# Patient Record
Sex: Male | Born: 1945 | Race: White | Hispanic: No | Marital: Married | Smoking: Former smoker
Health system: Southern US, Community
[De-identification: ages and names within clinical notes are randomized; demographics above are authoritative.]

## PROBLEM LIST (undated history)

## (undated) DIAGNOSIS — T7840XA Allergy, unspecified, initial encounter: Secondary | ICD-10-CM

## (undated) DIAGNOSIS — I350 Nonrheumatic aortic (valve) stenosis: Secondary | ICD-10-CM

## (undated) DIAGNOSIS — D649 Anemia, unspecified: Secondary | ICD-10-CM

## (undated) DIAGNOSIS — I340 Nonrheumatic mitral (valve) insufficiency: Secondary | ICD-10-CM

## (undated) DIAGNOSIS — D126 Benign neoplasm of colon, unspecified: Secondary | ICD-10-CM

## (undated) DIAGNOSIS — J449 Chronic obstructive pulmonary disease, unspecified: Secondary | ICD-10-CM

## (undated) DIAGNOSIS — N400 Enlarged prostate without lower urinary tract symptoms: Secondary | ICD-10-CM

## (undated) DIAGNOSIS — T8859XA Other complications of anesthesia, initial encounter: Secondary | ICD-10-CM

## (undated) DIAGNOSIS — R011 Cardiac murmur, unspecified: Secondary | ICD-10-CM

## (undated) DIAGNOSIS — I351 Nonrheumatic aortic (valve) insufficiency: Secondary | ICD-10-CM

## (undated) DIAGNOSIS — N182 Chronic kidney disease, stage 2 (mild): Secondary | ICD-10-CM

## (undated) DIAGNOSIS — I10 Essential (primary) hypertension: Secondary | ICD-10-CM

## (undated) HISTORY — DX: Benign prostatic hyperplasia without lower urinary tract symptoms: N40.0

## (undated) HISTORY — DX: Allergy, unspecified, initial encounter: T78.40XA

## (undated) HISTORY — DX: Anemia, unspecified: D64.9

## (undated) HISTORY — PX: COLONOSCOPY: SHX174

## (undated) HISTORY — DX: Essential (primary) hypertension: I10

## (undated) HISTORY — DX: Nonrheumatic mitral (valve) insufficiency: I34.0

## (undated) HISTORY — DX: Benign neoplasm of colon, unspecified: D12.6

## (undated) HISTORY — DX: Nonrheumatic aortic (valve) stenosis: I35.0

## (undated) HISTORY — PX: COLONOSCOPY W/ POLYPECTOMY: SHX1380

## (undated) HISTORY — DX: Nonrheumatic aortic (valve) insufficiency: I35.1

## (undated) HISTORY — DX: Chronic kidney disease, stage 2 (mild): N18.2

## (undated) HISTORY — DX: Chronic obstructive pulmonary disease, unspecified: J44.9

## (undated) HISTORY — PX: APPENDECTOMY: SHX54

---

## 2000-07-18 ENCOUNTER — Ambulatory Visit (HOSPITAL_COMMUNITY): Admission: RE | Admit: 2000-07-18 | Discharge: 2000-07-18 | Payer: Self-pay | Admitting: Gastroenterology

## 2005-01-24 ENCOUNTER — Ambulatory Visit: Payer: Self-pay | Admitting: Gastroenterology

## 2005-01-30 DIAGNOSIS — D126 Benign neoplasm of colon, unspecified: Secondary | ICD-10-CM

## 2005-01-30 HISTORY — DX: Benign neoplasm of colon, unspecified: D12.6

## 2005-02-18 ENCOUNTER — Ambulatory Visit: Payer: Self-pay | Admitting: Gastroenterology

## 2006-12-02 HISTORY — PX: GUM SURGERY: SHX658

## 2008-09-15 ENCOUNTER — Encounter (INDEPENDENT_AMBULATORY_CARE_PROVIDER_SITE_OTHER): Payer: Self-pay | Admitting: Otolaryngology

## 2008-09-15 ENCOUNTER — Ambulatory Visit (HOSPITAL_COMMUNITY): Admission: RE | Admit: 2008-09-15 | Discharge: 2008-09-15 | Payer: Self-pay | Admitting: Otolaryngology

## 2010-01-11 ENCOUNTER — Encounter (INDEPENDENT_AMBULATORY_CARE_PROVIDER_SITE_OTHER): Payer: Self-pay | Admitting: *Deleted

## 2010-06-21 ENCOUNTER — Encounter (INDEPENDENT_AMBULATORY_CARE_PROVIDER_SITE_OTHER): Payer: Self-pay | Admitting: *Deleted

## 2010-06-22 ENCOUNTER — Ambulatory Visit: Payer: Self-pay | Admitting: Gastroenterology

## 2010-07-06 ENCOUNTER — Ambulatory Visit: Payer: Self-pay | Admitting: Gastroenterology

## 2010-07-11 ENCOUNTER — Encounter: Payer: Self-pay | Admitting: Gastroenterology

## 2011-01-01 NOTE — Miscellaneous (Signed)
Summary: previsit/rm  Clinical Lists Changes  Medications: Added new medication of MOVIPREP 100 GM  SOLR (PEG-KCL-NACL-NASULF-NA ASC-C) As per prep instructions. - Signed Rx of MOVIPREP 100 GM  SOLR (PEG-KCL-NACL-NASULF-NA ASC-C) As per prep instructions.;  #1 x 0;  Signed;  Entered by: Sherren Kerns RN;  Authorized by: Meryl Dare MD [facility];  Method used: [facility]  [phone]*, [address], [place], [place]  [address], Ph: [phone] or [phone], Fax: [phone] Allergies: Added new allergy or adverse reaction of * INNOVAR Observations: Added new observation of ALLERGY REV: Done (06/22/2010 12:27) Added new observation of NKA: F (06/22/2010 12:27)    Prescriptions: MOVIPREP 100 GM  SOLR (PEG-KCL-NACL-NASULF-NA ASC-C) As per prep instructions.  #1 x 0   Entered by:   Sherren Kerns RN   Authorized by:   Meryl Dare MD [facility]   Signed by:   Sherren Kerns RN on 06/22/2010   Method used:   [facility]ronically to        [facility]  [phone]* (retail)       [address] [place], [place]  [address]       Ph: [phone] or [phone]       Fax: [phone]   RxID:   [id number]

## 2011-01-01 NOTE — Letter (Signed)
Summary: Moviprep Instructions  [facility]  [address]   [place], [place] [address]   Phone: [phone]  Fax: [phone]       Stephen Lang    05-21-1946    MRN: [id number]        Procedure Day /Date: Friday, 07-06-10     Arrival Time: 8:00 a.m.      Procedure Time: 9:00 a.m.     Location of Procedure:                    _x _  [facility] (4th Floor)  PREPARATION FOR COLONOSCOPY WITH MOVIPREP   Starting 5 days prior to your procedure  07-01-10 do not eat nuts, seeds, popcorn, corn, beans, peas,  salads, or any raw vegetables.  Do not take any fiber supplements (e.g. Metamucil, Citrucel, and Benefiber).  THE DAY BEFORE YOUR PROCEDURE         DATE: 07-05-10   DAY: Thursday  1.  Drink clear liquids the entire day-NO SOLID FOOD  2.  Do not drink anything colored red or purple.  Avoid juices with pulp.  No orange juice.  3.  Drink at least 64 oz. (8 glasses) of fluid/clear liquids during the day to prevent dehydration and help the prep work efficiently.  CLEAR LIQUIDS INCLUDE: Water Jello Ice Popsicles Tea (sugar ok, no milk/cream) Powdered fruit flavored drinks Coffee (sugar ok, no milk/cream) Gatorade Juice: apple, white grape, white cranberry  Lemonade Clear bullion, consomm, broth Carbonated beverages (any kind) Strained chicken noodle soup Hard Candy                             4.  In the morning, mix first dose of MoviPrep solution:    Empty 1 Pouch A and 1 Pouch B into the disposable container    Add lukewarm drinking water to the top line of the container. Mix to dissolve    Refrigerate (mixed solution should be used within 24 hrs)  5.  Begin drinking the prep at 5:00 p.m. The MoviPrep container is divided by 4 marks.   Every 15 minutes drink the solution down to the next mark (approximately 8 oz) until the full liter is complete.   6.  Follow completed prep with 16 oz of clear liquid of your choice (Nothing red or purple).   Continue to drink clear liquids until bedtime.  7.  Before going to bed, mix second dose of MoviPrep solution:    Empty 1 Pouch A and 1 Pouch B into the disposable container    Add lukewarm drinking water to the top line of the container. Mix to dissolve    Refrigerate  THE DAY OF YOUR PROCEDURE      DATE:  07-06-10  DAY: Friday  Beginning at 4:00 a.m. (5 hours before procedure):         1. Every 15 minutes, drink the solution down to the next mark (approx 8 oz) until the full liter is complete.  2. Follow completed prep with 16 oz. of clear liquid of your choice.    3. You may drink clear liquids until  7:00 a.m.  (2 HOURS BEFORE PROCEDURE).   MEDICATION INSTRUCTIONS  Unless otherwise instructed, you should take regular prescription medications with a small sip of water   as early as possible the morning of your procedure.    Additional medication instructions: n/a  OTHER INSTRUCTIONS  You will need a responsible adult at least 67 years of age to accompany you and drive you home.   This person must remain in the waiting room during your procedure.  Wear loose fitting clothing that is easily removed.  Leave jewelry and other valuables at home.  However, you may wish to bring a book to read or  an iPod/MP3 player to listen to music as you wait for your procedure to start.  Remove all body piercing jewelry and leave at home.  Total time from sign-in until discharge is approximately 2-3 hours.  You should go home directly after your procedure and rest.  You can resume normal activities the  day after your procedure.  The day of your procedure you should not:   Drive   Make legal decisions   Operate machinery   Drink alcohol   Return to work  You will receive specific instructions about eating, activities and medications before you leave.    The above instructions have been reviewed and explained to me by   Sherren Kerns RN  June 22, 2010 1:14  PM     I fully understand and can verbalize these instructions _____________________________ Date _________

## 2011-01-01 NOTE — Procedures (Signed)
Summary: Colonoscopy  Patient: Stephen Lang Note: All result statuses are Final unless otherwise noted.  Tests: (1) Colonoscopy (COL)   COL Colonoscopy           DONE     [facility]     [address]     [place], [place]  [address]           COLONOSCOPY PROCEDURE REPORT           PATIENT:  Stephen, Lang  MR#:  [id number]     BIRTHDATE:  04/07/1946, 63 yrs. old  GENDER:  male     ENDOSCOPIST:  Judie Petit T. Russella Dar, MD, [facility]           PROCEDURE DATE:  07/06/2010     PROCEDURE:  Colonoscopy with biopsy, snare polypectomy, and hot     biopsy     ASA CLASS:  Class II     INDICATIONS:  1) follow-up of polyp  2) surveillance and high-risk     screening, adenomatous polyp, 01/2005.     MEDICATIONS:   Demerol 50 mg IV, Versed 6 mg IV     DESCRIPTION OF PROCEDURE:   After the risks benefits and     alternatives of the procedure were thoroughly explained, informed     consent was obtained.  Digital rectal exam was performed and     revealed no abnormalities.   The LB [id number]-[id number] endoscope     was introduced through the anus and advanced to the cecum, which     was identified by both the appendix and ileocecal valve, without     limitations.  The quality of the prep was excellent, using     MoviPrep.  The instrument was then slowly withdrawn as the colon     was fully examined.     <<PROCEDUREIMAGES>>           FINDINGS:  A sessile polyp was found in the ascending colon. It     was 6 mm in size. Polyp was snared without cautery. Retrieval was     successful.  A sessile polyp was found in the ascending colon. It     was 2 mm in size. The polyp was removed using cold biopsy forceps.     A sessile polyp was found in the sigmoid colon. It was 5 mm in     size. With hot biopsy forceps the polyp was cauterized, biopsy was     obtained and sent to pathology.  This was otherwise a normal     examination of the colon. Retroflexed views in the rectum revealed     internal  hemorrhoids, small. The time to cecum =  3.75  minutes.     The scope was then withdrawn (time =  12.5  min) from the patient     and the procedure completed.           COMPLICATIONS:  None           ENDOSCOPIC IMPRESSION:     1) 6 mm sessile polyp in the ascending colon     2) 2 mm sessile polyp in the ascending colon     3) 5 mm sessile polyp in the sigmoid colon     4) Internal hemorrhoids           RECOMMENDATIONS:     1) No aspirin or NSAID's for 2 weeks     2) Await pathology results     3)  Repeat Colonoscopy in 5 years pending pathology review           Malcolm T. Russella Dar, MD, [facility]           CC: Georgann Housekeeper MD           n.     Rosalie DoctorVenita Lick. Stark at 07/06/2010 09:26 AM           Karle Plumber, [phone]  Note: An exclamation mark (!) indicates a result that was not dispersed into the flowsheet. Document Creation Date: 07/06/2010 9:27 AM _______________________________________________________________________  (1) Order result status: Final Collection or observation date-time: 07/06/2010 09:21 Requested date-time:  Receipt date-time:  Reported date-time:  Referring Physician:   Ordering Physician: Claudette Head [phone]) Specimen Source:  Source: Launa Grill Order Number: [id number] Lab site:   Appended Document: Colonoscopy     Procedures Next Due Date:    Colonoscopy: 07/2015

## 2011-01-01 NOTE — Letter (Signed)
Summary: Colonoscopy Letter  [facility]  [address] [place], [place] [address]   Phone: [phone]  Fax: [phone]      January 11, 2010 MRN: [id number]   [facility] [address] [place], [place]  [address]   Dear Mr. Bergstresser,   According to your medical record, it is time for you to schedule a Colonoscopy. The American Cancer Society recommends this procedure as a method to detect early colon cancer. Patients with a family history of colon cancer, or a personal history of colon polyps or inflammatory bowel disease are at increased risk.  This letter has beeen generated based on the recommendations made at the time of your procedure. If you feel that in your particular situation this may no longer apply, please contact our office.  Please call our office at [phone] to schedule this appointment or to update your records at your earliest convenience.  Thank you for cooperating with [place] to provide you with the very best care possible.   Sincerely,  Judie Petit T. Russella Dar, M.D.  [facility] [phone]

## 2011-01-01 NOTE — Letter (Signed)
Summary: Patient Notice- Polyp Results  [facility]  [address] [place], [place] [address]   Phone: [phone]  Fax: [phone]        July 11, 2010 MRN: [id number]    [facility] [address] [place], [place]  [address]    Dear Mr. Grippi,  I am pleased to inform you that the colon polyp(s) removed during your recent colonoscopy was (were) found to be benign (no cancer detected) upon pathologic examination.  I recommend you have a repeat colonoscopy examination in 5 years to look for recurrent polyps, as having colon polyps increases your risk for having recurrent polyps or even colon cancer in the future.  Should you develop new or worsening symptoms of abdominal pain, bowel habit changes or bleeding from the rectum or bowels, please schedule an evaluation with either your primary care physician or with me.  Continue treatment plan as outlined the day of your exam.  Please call us if you are having persistent problems or have questions about your condition that have not been fully answered at this time.  Sincerely,  Meryl Dare MD [facility]  This letter has been electronically signed by your physician.  Appended Document: Patient Notice- Polyp Results letter mailed

## 2011-04-16 NOTE — Op Note (Signed)
NAMEDEMONTEZ, NOVACK               ACCOUNT NO.:  [id number]   MEDICAL RECORD NO.:  [id number]          PATIENT TYPE:  AMB   LOCATION:  SDS                          FACILITY:  [facility]   PHYSICIAN:  Kinnie Scales. Annalee Genta, M.D.DATE OF BIRTH:  [birth year]   DATE OF PROCEDURE:  09/15/2008  DATE OF DISCHARGE:                               OPERATIVE REPORT   LOCATION:  [facility] OR.   PREOPERATIVE DIAGNOSES:  1. 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  2. Multiple prior root canals right maxillary dentition.   POSTOPERATIVE DIAGNOSES:  1. 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  2. Multiple prior root canals right maxillary dentition.   INDICATIONS FOR SURGERY:  1. 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  2. Multiple prior root canals right maxillary dentition.   SURGICAL PROCEDURE:  Right Caldwell-Luc procedure with biopsy and  removal of right maxillary sinus mass.   SURGEON:  Kinnie Scales. Annalee Genta, MD   ANESTHESIA:  General endotracheal.   COMPLICATIONS:  None.   ESTIMATED BLOOD LOSS:  Less than 50 mL.   The patient transferred from the operating room to the recovery room in  stable condition.   FINDINGS:  The patient was found to have a cystic-appearing mass in the  inferior aspect of the right maxillary sinus with anterior maxillary  bone erosion and some soft tissue extension into the anterior maxillary  soft tissue.  The mass showed degenerative changes with mucopurulent  drainage, which was cultured.  Initial frozen section showed no evidence  of malignancy.  Remainder of the specimen was sent to pathology for  permanent pathologic evaluation.   BRIEF HISTORY:  Mr. Remmers is a 65 year old white male who is referred  by his endodontist, Dr. Retta Mac for evaluation of a right maxillary sinus  mass with erosion of the anterior maxilla.  Over the last 6 months, the  patient undergone 3 root  canals in the right maxillary dentition,  initially treated in February.  The patient was found to have a bony  erosion, followup evaluation and retreatment in September showed  continued bone erosion without healing and soft tissue opacification of  the inferior aspect of the right maxillary sinus.  The patient was  evaluated in our office, had no prior sinus history.  He was essentially  asymptomatic with the exception of right maxillary dental pain.  A CT  scan was obtained, which showed soft tissue filling the lower half of  the maxillary sinus with extension from the sinus through a bone erosion  in the anterior maxilla with extension into the soft tissue of the right  cheek.  Given the findings and physical examination, I recommended to  undertake a right Caldwell-Luc for biopsy and removal of abnormal  tissue.  The risks, benefits, and possible complications of the  procedure were discussed in detail with the patient and his wife and  they understood and concurred with our plan for surgery, which was  scheduled as an outpatient under general anesthesia at [facility] OR.   PROCEDURE:  The patient was brought to the operating room on September 15, 2008, placed in supine position on the operating table.  General  endotracheal anesthesia was established without difficulty.  When the  patient was adequately anesthetized, he was injected with a total of 3  mL of 1% lidocaine, and 1:1000 solution of epinephrine, which was  injected in a submucosal fashion in the right maxillary gingivobuccal  sulcus.  After allowing adequate time for vasoconstriction and  hemostasis, the patient was positioned on the operating table, prepped  and draped in a sterile fashion.   A 3-cm horizontally oriented mucosal incision was created in the right  maxillary gingivobuccal sulcus.  The mucosa and soft tissue of the cheek  was then gently elevated.  The area of bony dehiscence was gently   palpated and the elevation was extended superiorly through the area of  intact maxillary bone.  The soft tissue surrounding the bone erosion was  then gently elevated and included in the surgical specimen.  The  inferior aspect of the maxillary sinus mucosa was then elevated free and  removed, small portion was sent to pathology for frozen section analysis  and was returned as benign appearing.  Remainder of the surgical  specimen was sent for additional microscopic analysis.  There was a  thick mucopurulent material within the inferior aspect of the maxillary  sinus and this was cultured for aerobic and anaerobic culture and Gram  stain.  The inflamed, thickened abnormal-appearing mucosa was then  completely resected preserving maxillary sinus mucosa in the superior,  lateral, and posterior aspect of the maxillary sinus including the  natural ostium of the maxillary sinus.  The patient's maxillary sinus  was then thoroughly irrigated with copious amounts of sterile saline.  The incision was then closed in layers with approximation of the  periosteum and deep muscular layer with a 4-0 chromic suture and final  skin edge approximation with interrupted 4-0 chromic.  The patient was  then awakened from his anesthetic, extubated, and transferred from the  operating room to recovery in stable condition.  There were no  complications and blood loss was less than 50 mL.           ______________________________  Kinnie Scales. Annalee Genta, M.D.     DLS/MEDQ  D:  16/09/9603  T:  09/15/2008  Job:  [id number]

## 2011-04-19 NOTE — Op Note (Signed)
[facility]  Patient:    Stephen Lang, Stephen Lang                        MRN: [id number] Proc. Date: 07/18/00 Attending:  Verlin Grills, M.D.                           Operative Report  PROCEDURE:  Flexible proctocolonoscopy to the splenic flexure.  REFERRING PHYSICIAN:  Dr. Lum Babe.  INDICATIONS FOR PROCEDURE:  [name] is a 65 year old male who is referred by Dr. Demetrius Revel for colorectal neoplasia screening. [name] was scheduled to undergo a complete colonoscopy. I was only able to advance the endoscope to the hepatic flexure due to colonic loop formation. The colon was only adequately prepped from the rectum to the splenic flexure. The exam today only adequately includes the left colon.  I discussed with the patient the complications associated with colonoscopy and polypectomy including intestinal bleeding and intestinal perforation. The patient has signed the operative permit.  ENDOSCOPIST:  Verlin Grills, M.D.  PREMEDICATION:  Versed 5 mg.  ENDOSCOPE:  Olympus pediatric colonoscope.  DESCRIPTION OF PROCEDURE:  After obtaining informed consent, the patient was placed in the left lateral decubitus position. I administered intravenous Versed to achieve conscious sedation for the procedure. The patients cardiac rhythm, oxygen saturation and blood pressure were monitored throughout the procedure and documented in the medical record.  Anal inspection was normal. Digital rectal exam revealed a non-nodular prostate. The Olympus pediatric video colonoscope was introduced into the rectum and under direct vision advanced to the hepatic flexure. Due to colonic loop formation, I was unable to examine the ascending colon, cecum, or ileocecal valve. Due to a stool filled hepatic flexure and transverse colon, I was unable to adequately examine this portion of the colon. The colon was well prepped from the splenic flexure to the  rectum.  Endoscopic appearance of the rectum, sigmoid colon, descending colon, and splenic flexure was normal. There is no evidence of colorectal neoplasia.  ASSESSMENT:  Normal proctocolonoscopy to the splenic flexure.  RECOMMENDATIONS:  I would recommend an air contrast barium enema if it is felt [name] needs a complete colon exam. DD:  07/18/00 TD:  07/19/00 Job: [id number]

## 2011-09-03 LAB — CBC
HCT: 45.2
Hemoglobin: 15.3
MCHC: 33.8
RBC: 4.82

## 2011-09-03 LAB — BASIC METABOLIC PANEL
CO2: 26
Calcium: 9.6
Chloride: 105
GFR calc Af Amer: >60
Potassium: 4.8
Sodium: 138

## 2011-09-03 LAB — ANAEROBIC CULTURE

## 2011-09-03 LAB — CULTURE, ROUTINE-SINUS

## 2012-02-20 ENCOUNTER — Other Ambulatory Visit: Payer: Self-pay | Admitting: Dermatology

## 2012-04-16 ENCOUNTER — Other Ambulatory Visit (HOSPITAL_COMMUNITY): Payer: Self-pay | Admitting: Internal Medicine

## 2012-04-16 DIAGNOSIS — J449 Chronic obstructive pulmonary disease, unspecified: Secondary | ICD-10-CM

## 2012-05-07 ENCOUNTER — Encounter (HOSPITAL_COMMUNITY): Payer: Self-pay

## 2012-06-25 ENCOUNTER — Encounter (HOSPITAL_COMMUNITY): Payer: Self-pay

## 2012-07-02 ENCOUNTER — Ambulatory Visit (HOSPITAL_COMMUNITY)
Admission: RE | Admit: 2012-07-02 | Discharge: 2012-07-02 | Disposition: A | Discharge status: Home / Self Care | Payer: BC Managed Care – PPO | Source: Ambulatory Visit | Attending: Internal Medicine | Admitting: Internal Medicine

## 2012-07-02 DIAGNOSIS — J4489 Other specified chronic obstructive pulmonary disease: Secondary | ICD-10-CM | POA: Insufficient documentation

## 2012-07-02 DIAGNOSIS — J449 Chronic obstructive pulmonary disease, unspecified: Secondary | ICD-10-CM | POA: Insufficient documentation

## 2012-07-02 MED ORDER — ALBUTEROL SULFATE (5 MG/ML) 0.5% IN NEBU
2.5000 mg | INHALATION_SOLUTION | Freq: Once | RESPIRATORY_TRACT | Status: AC
  Administered 2012-07-02: 2.5 mg via RESPIRATORY_TRACT

## 2012-07-13 DIAGNOSIS — R319 Hematuria, unspecified: Secondary | ICD-10-CM | POA: Insufficient documentation

## 2012-07-13 DIAGNOSIS — N400 Enlarged prostate without lower urinary tract symptoms: Secondary | ICD-10-CM | POA: Insufficient documentation

## 2012-07-14 ENCOUNTER — Other Ambulatory Visit: Payer: Self-pay | Admitting: Urology

## 2012-07-14 DIAGNOSIS — R319 Hematuria, unspecified: Secondary | ICD-10-CM

## 2012-07-16 ENCOUNTER — Other Ambulatory Visit: Payer: BC Managed Care – PPO

## 2012-07-16 ENCOUNTER — Ambulatory Visit
Admission: RE | Admit: 2012-07-16 | Discharge: 2012-07-16 | Disposition: A | Discharge status: Home / Self Care | Payer: BC Managed Care – PPO | Source: Ambulatory Visit | Attending: Urology | Admitting: Urology

## 2012-07-16 DIAGNOSIS — R319 Hematuria, unspecified: Secondary | ICD-10-CM

## 2014-07-15 DIAGNOSIS — N320 Bladder-neck obstruction: Secondary | ICD-10-CM | POA: Insufficient documentation

## 2015-06-12 ENCOUNTER — Encounter: Payer: Self-pay | Admitting: Gastroenterology

## 2015-07-20 DIAGNOSIS — N401 Enlarged prostate with lower urinary tract symptoms: Secondary | ICD-10-CM

## 2015-07-20 DIAGNOSIS — N138 Other obstructive and reflux uropathy: Secondary | ICD-10-CM | POA: Insufficient documentation

## 2015-09-28 ENCOUNTER — Telehealth: Payer: Self-pay | Admitting: Gastroenterology

## 2015-09-28 NOTE — Telephone Encounter (Signed)
Patient is due for recall now according to letter from 2011

## 2015-09-28 NOTE — Telephone Encounter (Signed)
Called pt to advice him. No answer. Left a message oh his answering machine.

## 2015-10-19 ENCOUNTER — Encounter: Payer: Self-pay | Admitting: Gastroenterology

## 2015-12-01 ENCOUNTER — Ambulatory Visit (AMBULATORY_SURGERY_CENTER): Payer: Self-pay | Admitting: *Deleted

## 2015-12-01 VITALS — Ht 72.0 in | Wt 202.0 lb

## 2015-12-01 DIAGNOSIS — Z86010 Personal history of colonic polyps: Secondary | ICD-10-CM

## 2015-12-01 MED ORDER — NA SULFATE-K SULFATE-MG SULF 17.5-3.13-1.6 GM/177ML PO SOLN: ORAL | Status: DC

## 2015-12-01 NOTE — Progress Notes (Signed)
Patient denies any allergies to eggs or soy. Patient denies any problems with anesthesia/sedation. Patient denies any oxygen use at home and does not take any diet/weight loss medications. EMMI education assisgned to patient on colonoscopy, this was explained and instructions given to patient. 

## 2015-12-15 ENCOUNTER — Encounter: Payer: Self-pay | Admitting: Gastroenterology

## 2016-01-12 ENCOUNTER — Encounter: Payer: Self-pay | Admitting: Gastroenterology

## 2016-02-02 ENCOUNTER — Encounter: Payer: Self-pay | Admitting: Gastroenterology

## 2016-02-02 ENCOUNTER — Ambulatory Visit (AMBULATORY_SURGERY_CENTER): Payer: BLUE CROSS/BLUE SHIELD | Admitting: Gastroenterology

## 2016-02-02 VITALS — BP 117/65 | HR 72 | Temp 97.9°F | Resp 14 | Ht 72.0 in | Wt 202.0 lb

## 2016-02-02 DIAGNOSIS — Z86010 Personal history of colonic polyps: Secondary | ICD-10-CM | POA: Diagnosis not present

## 2016-02-02 DIAGNOSIS — D123 Benign neoplasm of transverse colon: Secondary | ICD-10-CM

## 2016-02-02 DIAGNOSIS — D120 Benign neoplasm of cecum: Secondary | ICD-10-CM | POA: Diagnosis not present

## 2016-02-02 MED ORDER — SODIUM CHLORIDE 0.9 % IV SOLN: 500.0000 mL | INTRAVENOUS | Status: DC

## 2016-02-02 NOTE — Progress Notes (Signed)
Called to room to assist during endoscopic procedure.  Patient ID and intended procedure confirmed with present staff. Received instructions for my participation in the procedure from the performing physician.  

## 2016-02-02 NOTE — Progress Notes (Signed)
Stable to RR 

## 2016-02-02 NOTE — Patient Instructions (Signed)
YOU HAD AN ENDOSCOPIC PROCEDURE TODAY AT [facility]:   Refer to the procedure report that was given to you for any specific questions about what was found during the examination.  If the procedure report does not answer your questions, please call your gastroenterologist to clarify.  If you requested that your care partner not be given the details of your procedure findings, then the procedure report has been included in a sealed envelope for you to review at your convenience later.  YOU SHOULD EXPECT: Some feelings of bloating in the abdomen. Passage of more gas than usual.  Walking can help get rid of the air that was put into your GI tract during the procedure and reduce the bloating. If you had a lower endoscopy (such as a colonoscopy or flexible sigmoidoscopy) you may notice spotting of blood in your stool or on the toilet paper. If you underwent a bowel prep for your procedure, you may not have a normal bowel movement for a few days.  Please Note:  You might notice some irritation and congestion in your nose or some drainage.  This is from the oxygen used during your procedure.  There is no need for concern and it should clear up in a day or so.  SYMPTOMS TO REPORT IMMEDIATELY:   Following lower endoscopy (colonoscopy or flexible sigmoidoscopy):  Excessive amounts of blood in the stool  Significant tenderness or worsening of abdominal pains  Swelling of the abdomen that is new, acute  Fever of 100F or higher   For urgent or emergent issues, a gastroenterologist can be reached at any hour by calling [phone].   DIET: Your first meal following the procedure should be a small meal and then it is ok to progress to your normal diet. Heavy or fried foods are harder to digest and may make you feel nauseous or bloated.  Likewise, meals heavy in dairy and vegetables can increase bloating.  Drink plenty of fluids but you should avoid alcoholic beverages for 24  hours.  ACTIVITY:  You should plan to take it easy for the rest of today and you should NOT DRIVE or use heavy machinery until tomorrow (because of the sedation medicines used during the test).    FOLLOW UP: Our staff will call the number listed on your records the next business day following your procedure to check on you and address any questions or concerns that you may have regarding the information given to you following your procedure. If we do not reach you, we will leave a message.  However, if you are feeling well and you are not experiencing any problems, there is no need to return our call.  We will assume that you have returned to your regular daily activities without incident.  If any biopsies were taken you will be contacted by phone or by letter within the next 1-3 weeks.  Please call us at [phone] if you have not heard about the biopsies in 3 weeks.    SIGNATURES/CONFIDENTIALITY: You and/or your care partner have signed paperwork which will be entered into your electronic medical record.  These signatures attest to the fact that that the information above on your After Visit Summary has been reviewed and is understood.  Full responsibility of the confidentiality of this discharge information lies with you and/or your care-partner.  Polyp/ Hemorrhoid handout given Await pathology results Repeat Colonoscopy in 3-4 years

## 2016-02-02 NOTE — Op Note (Addendum)
[name] [name], [address]   COLONOSCOPY PROCEDURE REPORT  PATIENT: Stephen Lang, Stephen Lang  MR#: [id number] BIRTHDATE: Sep 21, 1946 , 69  yrs. old GENDER: male ENDOSCOPIST: Ladene Artist, MD, [facility] REFERRED BY:  Domenick Gong, M.D. PROCEDURE DATE:  02/02/2016 PROCEDURE:   Colonoscopy, surveillance , Colonoscopy with biopsy, and Colonoscopy with snare polypectomy First Screening Colonoscopy - Avg.  risk and is 50 yrs.  old or older - No.  Prior Negative Screening - Now for repeat screening. N/A  History of Adenoma - Now for follow-up colonoscopy & has been > or = to 3 yrs.  Yes hx of adenoma.  Has been 3 or more years since last colonoscopy.  Polyps removed today? Yes ASA CLASS:   Class II INDICATIONS:Surveillance due to prior colonic neoplasia and PH Colon Adenoma. MEDICATIONS: Monitored anesthesia care, Propofol 400 mg IV, and lidocaine 40 mg IV DESCRIPTION OF PROCEDURE:   After the risks benefits and alternatives of the procedure were thoroughly explained, informed consent was obtained.  The digital rectal exam revealed no abnormalities of the rectum.   The LB [id number]-[id number]  endoscope was introduced through the anus and advanced to the cecum, which was identified by both the appendix and ileocecal valve. No adverse events experienced.   Limited by a tortuous and redundant colon. The quality of the prep was poor.  (Suprep was used)  The instrument was then slowly withdrawn as the colon was fully examined. Estimated blood loss is zero unless otherwise noted in this procedure report.  COLON FINDINGS: Two sessile polyps measuring 5-7 mm in size were found in the transverse colon and at the cecum.  Polypectomies were performed with a cold snare.  The resection was complete, the polyp tissue was completely retrieved and sent to histology.   Two sessile polyps measuring 4-5 mm in size were found in the transverse colon.  Polypectomies were  performed with cold forceps. The resection was complete, the polyp tissue was completely retrieved and sent to histology. 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  The examination was otherwise normal. Retroflexed views revealed internal Grade I hemorrhoids. The time to cecum = 3.9 Withdrawal time = 14.8   The scope was withdrawn and the procedure completed. COMPLICATIONS: There were no immediate complications.  ENDOSCOPIC IMPRESSION: 1.   Two sessile polyps in the transverse colon and at the cecum; polypectomies performed with a cold snare 2.   Two sessile polyps in the transverse colon; polypectomies performed with cold forceps 3.   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   Grade l internal hemorrhoids  RECOMMENDATIONS: 1.  Await pathology results 2.  Repeat colonoscopy in 3 years if 3-4 polyps adenomatous; otherwise 5 years  eSigned:  Ladene Artist, MD, [facility] 02/02/2016 3:20 PM Revised: 02/02/2016 3:20 PM

## 2016-02-05 ENCOUNTER — Telehealth: Payer: Self-pay | Admitting: *Deleted

## 2016-02-05 NOTE — Telephone Encounter (Signed)
  Follow up Call-  Call back number 02/02/2016  Post procedure Call Back phone  # [phone]  Permission to leave phone message Yes     Patient questions:  Do you have a fever, pain , or abdominal swelling? No. Pain Score  0 *  Have you tolerated food without any problems? Yes.    Have you been able to return to your normal activities? Yes.    Do you have any questions about your discharge instructions: Diet   No. Medications  No. Follow up visit  No.  Do you have questions or concerns about your Care? No.  Actions: * If pain score is 4 or above: No action needed, pain <4.

## 2016-02-19 ENCOUNTER — Encounter: Payer: Self-pay | Admitting: Gastroenterology

## 2016-03-22 DIAGNOSIS — J310 Chronic rhinitis: Secondary | ICD-10-CM | POA: Insufficient documentation

## 2016-03-22 DIAGNOSIS — J302 Other seasonal allergic rhinitis: Secondary | ICD-10-CM | POA: Insufficient documentation

## 2018-01-22 ENCOUNTER — Ambulatory Visit: Payer: BLUE CROSS/BLUE SHIELD | Admitting: Gastroenterology

## 2018-01-29 ENCOUNTER — Ambulatory Visit: Payer: BLUE CROSS/BLUE SHIELD | Admitting: Gastroenterology

## 2018-02-26 ENCOUNTER — Ambulatory Visit: Payer: BLUE CROSS/BLUE SHIELD | Admitting: Gastroenterology

## 2018-04-03 ENCOUNTER — Encounter: Payer: Self-pay | Admitting: Gastroenterology

## 2018-04-03 ENCOUNTER — Ambulatory Visit: Payer: Medicare Other | Admitting: Gastroenterology

## 2018-04-03 ENCOUNTER — Encounter (INDEPENDENT_AMBULATORY_CARE_PROVIDER_SITE_OTHER): Payer: Self-pay

## 2018-04-03 VITALS — BP 124/50 | HR 72 | Ht 72.0 in | Wt 188.0 lb

## 2018-04-03 DIAGNOSIS — Z86010 Personal history of colonic polyps: Secondary | ICD-10-CM | POA: Diagnosis not present

## 2018-04-03 DIAGNOSIS — R195 Other fecal abnormalities: Secondary | ICD-10-CM | POA: Diagnosis not present

## 2018-04-03 MED ORDER — NA SULFATE-K SULFATE-MG SULF 17.5-3.13-1.6 GM/177ML PO SOLN: 1.0000 | Freq: Once | ORAL | 0 refills | Status: AC

## 2018-04-03 NOTE — Patient Instructions (Signed)
You have been scheduled for a colonoscopy. Please follow written instructions given to you at your visit today.  Please pick up your prep supplies at the pharmacy within the next 1-3 days. If you use inhalers (even only as needed), please bring them with you on the day of your procedure. Your physician has requested that you go to www.startemmi.com and enter the access code given to you at your visit today. This web site gives a general overview about your procedure. However, you should still follow specific instructions given to you by our office regarding your preparation for the procedure.  Normal BMI (Body Mass Index- based on height and weight) is between 23 and 30. Your BMI today is Body mass index is 25.5 kg/m. Marland Kitchen Please consider follow up  regarding your BMI with your Primary Care Provider.  Thank you for choosing me and [facility].  Pricilla Riffle. Dagoberto Ligas., MD., [facility]

## 2018-04-03 NOTE — Progress Notes (Signed)
    History of Present Illness: This is a 72 year old male with occult blood in his stool.  Recent screening Hemosure testing performed at Dr. Loren Racer office was positive.  He underwent colonoscopy in March 2017 showing 4 small adenomatous colon polyps, an AVM and internal hemorrhoids.  He has no gastrointestinal complaints. Denies weight loss, abdominal pain, constipation, diarrhea, change in stool caliber, melena, hematochezia, nausea, vomiting, dysphagia, reflux symptoms, chest pain.  Current Medications, Allergies, Past Medical History, Past Surgical History, Family History and Social History were reviewed in [employer] record.  Physical Exam: General: Well developed, well nourished, no acute distress Head: Normocephalic and atraumatic Eyes:  sclerae anicteric, EOMI Ears: Normal auditory acuity Mouth: No deformity or lesions Lungs: Clear throughout to auscultation Heart: Regular rate and rhythm; no murmurs, rubs or bruits Abdomen: Soft, non tender and non distended. No masses, hepatosplenomegaly or hernias noted. Normal Bowel sounds Rectal: Deferred to colonoscopy Musculoskeletal: Symmetrical with no gross deformities  Pulses:  Normal pulses noted Extremities: No clubbing, cyanosis, edema or deformities noted Neurological: Alert oriented x 4, grossly nonfocal Psychological:  Alert and cooperative. Normal mood and affect  Assessment and Recommendations:  1.  Hemosure positive stool likely secondary to known AVM or internal hemorrhoids. I advised  against routine screening Hemosure testing for 5 years following a complete, well-prepped colonoscopy.  Schedule colonoscopy.  The risks (including bleeding, perforation, infection, missed lesions, medication reactions and possible hospitalization or surgery if complications occur), benefits, and alternatives to colonoscopy with possible biopsy and possible polypectomy were discussed with the patient and they consent to  proceed.   2. Personal history of four adenomatous colon polyps.  Colonoscopy as above.

## 2018-04-16 DIAGNOSIS — H5203 Hypermetropia, bilateral: Secondary | ICD-10-CM | POA: Diagnosis not present

## 2018-04-16 DIAGNOSIS — H524 Presbyopia: Secondary | ICD-10-CM | POA: Diagnosis not present

## 2018-04-16 DIAGNOSIS — H25013 Cortical age-related cataract, bilateral: Secondary | ICD-10-CM | POA: Diagnosis not present

## 2018-04-27 ENCOUNTER — Encounter: Payer: Self-pay | Admitting: Gastroenterology

## 2018-05-04 DIAGNOSIS — H2511 Age-related nuclear cataract, right eye: Secondary | ICD-10-CM | POA: Diagnosis not present

## 2018-05-08 ENCOUNTER — Encounter: Payer: Self-pay | Admitting: Gastroenterology

## 2018-05-08 ENCOUNTER — Ambulatory Visit (AMBULATORY_SURGERY_CENTER): Payer: Medicare Other | Admitting: Gastroenterology

## 2018-05-08 ENCOUNTER — Other Ambulatory Visit: Payer: Self-pay

## 2018-05-08 VITALS — BP 128/68 | HR 60 | Temp 98.0°F | Resp 16 | Ht 72.0 in | Wt 188.0 lb

## 2018-05-08 DIAGNOSIS — R195 Other fecal abnormalities: Secondary | ICD-10-CM

## 2018-05-08 DIAGNOSIS — N400 Enlarged prostate without lower urinary tract symptoms: Secondary | ICD-10-CM | POA: Diagnosis not present

## 2018-05-08 DIAGNOSIS — Z86010 Personal history of colonic polyps: Secondary | ICD-10-CM | POA: Diagnosis present

## 2018-05-08 DIAGNOSIS — D122 Benign neoplasm of ascending colon: Secondary | ICD-10-CM | POA: Diagnosis not present

## 2018-05-08 DIAGNOSIS — J449 Chronic obstructive pulmonary disease, unspecified: Secondary | ICD-10-CM | POA: Diagnosis not present

## 2018-05-08 MED ORDER — SODIUM CHLORIDE 0.9 % IV SOLN
500.0000 mL | Freq: Once | INTRAVENOUS | Status: DC
Start: 2018-05-08 — End: 2023-06-17

## 2018-05-08 NOTE — Patient Instructions (Signed)
  Please read handouts on polyps and hemorrhoids.     YOU HAD AN ENDOSCOPIC PROCEDURE TODAY AT THE [facility]:   Refer to the procedure report that was given to you for any specific questions about what was found during the examination.  If the procedure report does not answer your questions, please call your gastroenterologist to clarify.  If you requested that your care partner not be given the details of your procedure findings, then the procedure report has been included in a sealed envelope for you to review at your convenience later.  YOU SHOULD EXPECT: Some feelings of bloating in the abdomen. Passage of more gas than usual.  Walking can help get rid of the air that was put into your GI tract during the procedure and reduce the bloating. If you had a lower endoscopy (such as a colonoscopy or flexible sigmoidoscopy) you may notice spotting of blood in your stool or on the toilet paper. If you underwent a bowel prep for your procedure, you may not have a normal bowel movement for a few days.  Please Note:  You might notice some irritation and congestion in your nose or some drainage.  This is from the oxygen used during your procedure.  There is no need for concern and it should clear up in a day or so.  SYMPTOMS TO REPORT IMMEDIATELY:   Following lower endoscopy (colonoscopy or flexible sigmoidoscopy):  Excessive amounts of blood in the stool  Significant tenderness or worsening of abdominal pains  Swelling of the abdomen that is new, acute  Fever of 100F or higher   For urgent or emergent issues, a gastroenterologist can be reached at any hour by calling [phone].   DIET:  We do recommend a small meal at first, but then you may proceed to your regular diet.  Drink plenty of fluids but you should avoid alcoholic beverages for 24 hours.  ACTIVITY:  You should plan to take it easy for the rest of today and you should NOT DRIVE or use heavy machinery until tomorrow  (because of the sedation medicines used during the test).    FOLLOW UP: Our staff will call the number listed on your records the next business day following your procedure to check on you and address any questions or concerns that you may have regarding the information given to you following your procedure. If we do not reach you, we will leave a message.  However, if you are feeling well and you are not experiencing any problems, there is no need to return our call.  We will assume that you have returned to your regular daily activities without incident.  If any biopsies were taken you will be contacted by phone or by letter within the next 1-3 weeks.  Please call us at [phone] if you have not heard about the biopsies in 3 weeks.    SIGNATURES/CONFIDENTIALITY: You and/or your care partner have signed paperwork which will be entered into your electronic medical record.  These signatures attest to the fact that that the information above on your After Visit Summary has been reviewed and is understood.  Full responsibility of the confidentiality of this discharge information lies with you and/or your care-partner. 

## 2018-05-08 NOTE — Progress Notes (Signed)
Pt's states no medical or surgical changes since previsit or office visit. 

## 2018-05-08 NOTE — Progress Notes (Signed)
Called to room to assist during endoscopic procedure.  Patient ID and intended procedure confirmed with present staff. Received instructions for my participation in the procedure from the performing physician.  

## 2018-05-08 NOTE — Op Note (Signed)
[facility] Patient Name: Stephen Lang Procedure Date: 05/08/2018 2:57 PM MRN: [id number] Endoscopist: Ladene Artist , MD Age: 72 Referring MD:  Date of Birth: [birth year] Gender: Male Account #: [id number] Procedure:                Colonoscopy Indications:              Positive fecal immunochemical test. Personal                            history of adenomatous colon polyps. Medicines:                Monitored Anesthesia Care Procedure:                Pre-Anesthesia Assessment:                           - Prior to the procedure, a History and Physical                            was performed, and patient medications and                            allergies were reviewed. The patient's tolerance of                            previous anesthesia was also reviewed. The risks                            and benefits of the procedure and the sedation                            options and risks were discussed with the patient.                            All questions were answered, and informed consent                            was obtained. Prior Anticoagulants: The patient has                            taken no previous anticoagulant or antiplatelet                            agents. ASA Grade Assessment: II - A patient with                            mild systemic disease. After reviewing the risks                            and benefits, the patient was deemed in                            satisfactory condition to undergo the procedure.  After obtaining informed consent, the colonoscope                            was passed under direct vision. Throughout the                            procedure, the patient's blood pressure, pulse, and                            oxygen saturations were monitored continuously. The                            Colonoscope was introduced through the anus and                            advanced to the the cecum,  identified by                            appendiceal orifice and ileocecal valve. The                            ileocecal valve, appendiceal orifice, and rectum                            were photographed. The quality of the bowel                            preparation was adequate after extensive lavage and                            suctioning. The patient tolerated the procedure                            well. The colonoscopy was somewhat difficult due to                            a redundant colon and a tortuous colon. Scope In: 3:04:57 PM Scope Out: 3:24:38 PM Scope Withdrawal Time: 0 hours 13 minutes 38 seconds  Total Procedure Duration: 0 hours 19 minutes 41 seconds  Findings:                 The perianal and digital rectal examinations were                            normal.                           Two sessile polyps were found in the ascending                            colon. The polyps were 6 mm in size. These polyps                            were removed with a cold snare. Resection and  retrieval were complete.                           Three small (2) and medium (1) localized                            angiodysplastic lesions without bleeding were found                            in the cecum and ascending colon respectively.                           Internal hemorrhoids were found during                            retroflexion. The hemorrhoids were small and Grade                            I (internal hemorrhoids that do not prolapse).                           The exam was otherwise without abnormality on                            direct and retroflexion views. Complications:            No immediate complications. Estimated blood loss:                            None. Estimated Blood Loss:     Estimated blood loss: none. Impression:               - Two 6 mm polyps in the ascending colon, removed                            with a cold  snare. Resected and retrieved.                           - Three non-bleeding colonic angiodysplastic                            lesions.                           - Internal hemorrhoids.                           - The examination was otherwise normal on direct                            and retroflexion views. Recommendation:           - Repeat colonoscopy in 5 years for surveillance                            with a more extensive bowel prep.                           -  Patient has a contact number available for                            emergencies. The signs and symptoms of potential                            delayed complications were discussed with the                            patient. Return to normal activities tomorrow.                            Written discharge instructions were provided to the                            patient.                           - Resume previous diet.                           - Continue present medications.                           - Await pathology results. Ladene Artist, MD 05/08/2018 3:30:53 PM This report has been signed electronically.

## 2018-05-08 NOTE — Progress Notes (Signed)
Report to PACU, RN, vss, BBS= Clear.  

## 2018-05-11 ENCOUNTER — Telehealth: Payer: Self-pay

## 2018-05-11 NOTE — Telephone Encounter (Signed)
  Follow up Call-  Call back number 05/08/2018 02/02/2016  Post procedure Call Back phone  # [phone]  Permission to leave phone message Yes Yes  Some recent data might be hidden     Patient questions:  Do you have a fever, pain , or abdominal swelling? No. Pain Score  0 *  Have you tolerated food without any problems? Yes.    Have you been able to return to your normal activities? Yes.    Do you have any questions about your discharge instructions: Diet   No. Medications  No. Follow up visit  No.  Do you have questions or concerns about your Care? No.  Actions: * If pain score is 4 or above: No action needed, pain <4.  No problems noted per pt. maw

## 2018-05-25 DIAGNOSIS — H25811 Combined forms of age-related cataract, right eye: Secondary | ICD-10-CM | POA: Diagnosis not present

## 2018-05-25 DIAGNOSIS — H2511 Age-related nuclear cataract, right eye: Secondary | ICD-10-CM | POA: Diagnosis not present

## 2018-05-26 ENCOUNTER — Other Ambulatory Visit (HOSPITAL_COMMUNITY): Payer: Self-pay | Admitting: Respiratory Therapy

## 2018-05-26 DIAGNOSIS — Z0000 Encounter for general adult medical examination without abnormal findings: Secondary | ICD-10-CM

## 2018-05-27 ENCOUNTER — Encounter: Payer: Self-pay | Admitting: Gastroenterology

## 2018-06-02 DIAGNOSIS — H2512 Age-related nuclear cataract, left eye: Secondary | ICD-10-CM | POA: Diagnosis not present

## 2018-06-15 DIAGNOSIS — H2512 Age-related nuclear cataract, left eye: Secondary | ICD-10-CM | POA: Diagnosis not present

## 2018-06-15 DIAGNOSIS — H25812 Combined forms of age-related cataract, left eye: Secondary | ICD-10-CM | POA: Diagnosis not present

## 2018-06-30 DIAGNOSIS — H02881 Meibomian gland dysfunction right upper eyelid: Secondary | ICD-10-CM | POA: Diagnosis not present

## 2018-07-10 DIAGNOSIS — N401 Enlarged prostate with lower urinary tract symptoms: Secondary | ICD-10-CM | POA: Diagnosis not present

## 2018-07-10 DIAGNOSIS — N138 Other obstructive and reflux uropathy: Secondary | ICD-10-CM | POA: Diagnosis not present

## 2018-07-23 DIAGNOSIS — D485 Neoplasm of uncertain behavior of skin: Secondary | ICD-10-CM | POA: Diagnosis not present

## 2018-07-23 DIAGNOSIS — D235 Other benign neoplasm of skin of trunk: Secondary | ICD-10-CM | POA: Diagnosis not present

## 2018-07-23 DIAGNOSIS — L821 Other seborrheic keratosis: Secondary | ICD-10-CM | POA: Diagnosis not present

## 2018-10-01 DIAGNOSIS — Z23 Encounter for immunization: Secondary | ICD-10-CM | POA: Diagnosis not present

## 2019-02-02 DIAGNOSIS — H02881 Meibomian gland dysfunction right upper eyelid: Secondary | ICD-10-CM | POA: Diagnosis not present

## 2019-02-02 DIAGNOSIS — H02832 Dermatochalasis of right lower eyelid: Secondary | ICD-10-CM | POA: Diagnosis not present

## 2019-02-02 DIAGNOSIS — Z961 Presence of intraocular lens: Secondary | ICD-10-CM | POA: Diagnosis not present

## 2019-02-02 DIAGNOSIS — H02831 Dermatochalasis of right upper eyelid: Secondary | ICD-10-CM | POA: Diagnosis not present

## 2019-05-19 DIAGNOSIS — Z79899 Other long term (current) drug therapy: Secondary | ICD-10-CM | POA: Diagnosis not present

## 2019-05-19 DIAGNOSIS — R82998 Other abnormal findings in urine: Secondary | ICD-10-CM | POA: Diagnosis not present

## 2019-05-19 DIAGNOSIS — R7989 Other specified abnormal findings of blood chemistry: Secondary | ICD-10-CM | POA: Diagnosis not present

## 2019-05-19 DIAGNOSIS — Z125 Encounter for screening for malignant neoplasm of prostate: Secondary | ICD-10-CM | POA: Diagnosis not present

## 2019-07-12 DIAGNOSIS — N138 Other obstructive and reflux uropathy: Secondary | ICD-10-CM | POA: Diagnosis not present

## 2019-07-12 DIAGNOSIS — N401 Enlarged prostate with lower urinary tract symptoms: Secondary | ICD-10-CM | POA: Diagnosis not present

## 2019-07-29 DIAGNOSIS — L814 Other melanin hyperpigmentation: Secondary | ICD-10-CM | POA: Diagnosis not present

## 2019-07-29 DIAGNOSIS — D2262 Melanocytic nevi of left upper limb, including shoulder: Secondary | ICD-10-CM | POA: Diagnosis not present

## 2019-07-29 DIAGNOSIS — L570 Actinic keratosis: Secondary | ICD-10-CM | POA: Diagnosis not present

## 2019-07-29 DIAGNOSIS — D1801 Hemangioma of skin and subcutaneous tissue: Secondary | ICD-10-CM | POA: Diagnosis not present

## 2019-08-12 DIAGNOSIS — H02831 Dermatochalasis of right upper eyelid: Secondary | ICD-10-CM | POA: Diagnosis not present

## 2019-08-12 DIAGNOSIS — Z961 Presence of intraocular lens: Secondary | ICD-10-CM | POA: Diagnosis not present

## 2019-08-12 DIAGNOSIS — H26492 Other secondary cataract, left eye: Secondary | ICD-10-CM | POA: Diagnosis not present

## 2019-08-12 DIAGNOSIS — H02881 Meibomian gland dysfunction right upper eyelid: Secondary | ICD-10-CM | POA: Diagnosis not present

## 2019-08-27 DIAGNOSIS — Z23 Encounter for immunization: Secondary | ICD-10-CM | POA: Diagnosis not present

## 2019-09-09 DIAGNOSIS — H26491 Other secondary cataract, right eye: Secondary | ICD-10-CM | POA: Diagnosis not present

## 2020-02-08 DIAGNOSIS — H02831 Dermatochalasis of right upper eyelid: Secondary | ICD-10-CM | POA: Diagnosis not present

## 2020-02-08 DIAGNOSIS — H02881 Meibomian gland dysfunction right upper eyelid: Secondary | ICD-10-CM | POA: Diagnosis not present

## 2020-02-08 DIAGNOSIS — H02834 Dermatochalasis of left upper eyelid: Secondary | ICD-10-CM | POA: Diagnosis not present

## 2020-02-08 DIAGNOSIS — H11153 Pinguecula, bilateral: Secondary | ICD-10-CM | POA: Diagnosis not present

## 2020-04-05 DIAGNOSIS — L03115 Cellulitis of right lower limb: Secondary | ICD-10-CM | POA: Diagnosis not present

## 2020-04-05 DIAGNOSIS — W19XXXA Unspecified fall, initial encounter: Secondary | ICD-10-CM | POA: Diagnosis not present

## 2020-04-05 DIAGNOSIS — L0889 Other specified local infections of the skin and subcutaneous tissue: Secondary | ICD-10-CM | POA: Diagnosis not present

## 2020-05-26 DIAGNOSIS — Z125 Encounter for screening for malignant neoplasm of prostate: Secondary | ICD-10-CM | POA: Diagnosis not present

## 2020-05-26 DIAGNOSIS — R7989 Other specified abnormal findings of blood chemistry: Secondary | ICD-10-CM | POA: Diagnosis not present

## 2020-05-26 DIAGNOSIS — Z0000 Encounter for general adult medical examination without abnormal findings: Secondary | ICD-10-CM | POA: Diagnosis not present

## 2020-06-02 DIAGNOSIS — R82998 Other abnormal findings in urine: Secondary | ICD-10-CM | POA: Diagnosis not present

## 2020-07-17 DIAGNOSIS — N138 Other obstructive and reflux uropathy: Secondary | ICD-10-CM | POA: Diagnosis not present

## 2020-07-17 DIAGNOSIS — N401 Enlarged prostate with lower urinary tract symptoms: Secondary | ICD-10-CM | POA: Diagnosis not present

## 2020-08-01 DIAGNOSIS — L821 Other seborrheic keratosis: Secondary | ICD-10-CM | POA: Diagnosis not present

## 2020-08-01 DIAGNOSIS — D225 Melanocytic nevi of trunk: Secondary | ICD-10-CM | POA: Diagnosis not present

## 2020-08-01 DIAGNOSIS — D2262 Melanocytic nevi of left upper limb, including shoulder: Secondary | ICD-10-CM | POA: Diagnosis not present

## 2020-08-01 DIAGNOSIS — D2261 Melanocytic nevi of right upper limb, including shoulder: Secondary | ICD-10-CM | POA: Diagnosis not present

## 2020-09-25 DIAGNOSIS — I10 Essential (primary) hypertension: Secondary | ICD-10-CM | POA: Diagnosis not present

## 2020-09-25 DIAGNOSIS — H8113 Benign paroxysmal vertigo, bilateral: Secondary | ICD-10-CM | POA: Diagnosis not present

## 2020-09-25 DIAGNOSIS — I498 Other specified cardiac arrhythmias: Secondary | ICD-10-CM | POA: Diagnosis not present

## 2020-09-28 ENCOUNTER — Other Ambulatory Visit: Payer: Self-pay | Admitting: Family Medicine

## 2020-09-28 DIAGNOSIS — H8113 Benign paroxysmal vertigo, bilateral: Secondary | ICD-10-CM

## 2020-09-30 DIAGNOSIS — Z23 Encounter for immunization: Secondary | ICD-10-CM | POA: Diagnosis not present

## 2020-10-03 ENCOUNTER — Ambulatory Visit: Payer: Medicare Other

## 2020-10-03 ENCOUNTER — Other Ambulatory Visit: Payer: Self-pay

## 2020-10-03 DIAGNOSIS — H8113 Benign paroxysmal vertigo, bilateral: Secondary | ICD-10-CM

## 2020-10-10 DIAGNOSIS — N182 Chronic kidney disease, stage 2 (mild): Secondary | ICD-10-CM | POA: Diagnosis not present

## 2020-10-10 DIAGNOSIS — J449 Chronic obstructive pulmonary disease, unspecified: Secondary | ICD-10-CM | POA: Diagnosis not present

## 2020-10-10 DIAGNOSIS — I1310 Hypertensive heart and chronic kidney disease without heart failure, with stage 1 through stage 4 chronic kidney disease, or unspecified chronic kidney disease: Secondary | ICD-10-CM | POA: Diagnosis not present

## 2020-10-10 DIAGNOSIS — I351 Nonrheumatic aortic (valve) insufficiency: Secondary | ICD-10-CM | POA: Diagnosis not present

## 2020-10-12 DIAGNOSIS — K045 Chronic apical periodontitis: Secondary | ICD-10-CM | POA: Diagnosis not present

## 2020-10-31 DIAGNOSIS — I1310 Hypertensive heart and chronic kidney disease without heart failure, with stage 1 through stage 4 chronic kidney disease, or unspecified chronic kidney disease: Secondary | ICD-10-CM | POA: Diagnosis not present

## 2020-10-31 DIAGNOSIS — N182 Chronic kidney disease, stage 2 (mild): Secondary | ICD-10-CM | POA: Diagnosis not present

## 2020-10-31 DIAGNOSIS — I351 Nonrheumatic aortic (valve) insufficiency: Secondary | ICD-10-CM | POA: Diagnosis not present

## 2020-11-16 ENCOUNTER — Other Ambulatory Visit: Payer: Self-pay

## 2020-11-16 ENCOUNTER — Ambulatory Visit: Payer: Medicare Other | Admitting: Cardiovascular Disease

## 2020-11-16 ENCOUNTER — Encounter: Payer: Self-pay | Admitting: Cardiovascular Disease

## 2020-11-16 VITALS — BP 132/60 | HR 60 | Ht 72.0 in | Wt 191.0 lb

## 2020-11-16 DIAGNOSIS — Z9189 Other specified personal risk factors, not elsewhere classified: Secondary | ICD-10-CM

## 2020-11-16 DIAGNOSIS — I350 Nonrheumatic aortic (valve) stenosis: Secondary | ICD-10-CM

## 2020-11-16 DIAGNOSIS — I351 Nonrheumatic aortic (valve) insufficiency: Secondary | ICD-10-CM

## 2020-11-16 DIAGNOSIS — I10 Essential (primary) hypertension: Secondary | ICD-10-CM

## 2020-11-16 MED ORDER — LOSARTAN POTASSIUM 50 MG PO TABS: 50.0000 mg | ORAL_TABLET | Freq: Every day | ORAL | 3 refills | Status: DC

## 2020-11-16 NOTE — Progress Notes (Signed)
Chief Complaint  Patient presents with  . New Patient (Initial Visit)    HTN   History of Present Illness:74 yo male with history of HTN, prior tobacco abuse, anemia, COPD, CKD stage 2, BPH, aortic valve disease, mitral valve disease who is here today as a new consult, referred by Dr. Osborne Casco, for the evaluation of abnormal echo with aortic stenosis and insufficiency. He had dizziness in November 2021. This has not recurred. Echo ordered in primary care 10/03/20 showed LVEF=50-55%, moderate LVH. Mild MR. Mild aortic stenosis with mean gradient 18 mmHg, AVA 1.9 cm2. There is moderate aortic regurgitation. He is very active. He has no chest pain, dyspnea, dizziness, LE edema, palpitations. He smoked 1ppd for 50 years and quit in 2013. He is retired English as a second language teacher. His wife is a retired Advertising account planner at [employer].   Primary Care Physician: Haywood Pao, MD   Past Medical History:  Diagnosis Date  . Allergy    SEASONAL  . Anemia   . Aortic insufficiency   . Aortic stenosis   . BPH (benign prostatic hyperplasia)   . CKD (chronic kidney disease) stage 2, GFR 60-89 ml/min   . COPD (chronic obstructive pulmonary disease) ([place])   . HTN (hypertension)   . Mitral regurgitation   . 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 APPENDECTOMY    . GUM SURGERY  2008   tumor right upper gum     Current Outpatient Medications  Medication Sig Dispense Refill  . aspirin EC 81 MG tablet Take 1 tablet by mouth every other day.     . losartan (COZAAR) 50 MG tablet Take 1 tablet (50 mg total) by mouth daily. 90 tablet 3   Current Facility-Administered Medications  Medication Dose Route Frequency Provider Last Rate Last Admin  . 0.9 %  sodium chloride infusion  500 mL Intravenous Once Ladene Artist, MD        Allergies  Allergen Reactions  . Midazolam Anaphylaxis    History of reaction to a similar family of this anesthesia  . Other  Anaphylaxis    Innovar=respiratory arrest  . Droperidol Other (See Comments)    other    Social History   Socioeconomic History  . Marital status: Married    Spouse name: Not on file  . Number of children: 2  . Years of education: Not on file  . Highest education level: Not on file  Occupational History  . Occupation: Retired Futures trader  Tobacco Use  . Smoking status: Former Smoker    Packs/day: 1.00    Years: 50.00    Pack years: 50.00    Types: Cigarettes    Quit date: 12/03/2011    Years since quitting: 8.9  . Smokeless tobacco: Never Used  Substance and Sexual Activity  . Alcohol use: Yes    Alcohol/week: 0.0 standard drinks    Comment: wine monthly maybe per pt.   . Drug use: No  . Sexual activity: Not on file  Other Topics Concern  . Not on file  Social History Narrative  . Not on file   Social Determinants of Health   Financial Resource Strain: Not on file  Food Insecurity: Not on file  Transportation Needs: Not on file  Physical Activity: Not on file  Stress: Not on file  Social Connections: Not on file  Intimate Partner Violence: Not on file    Family History  Problem Relation  Age of Onset  . Colon cancer Maternal Aunt        age 35-60  . CAD Father   . Diabetes Brother     Review of Systems:  As stated in the HPI and otherwise negative.   BP 132/60   Pulse 60   Ht 6' (1.829 m)   Wt 191 lb (86.6 kg)   SpO2 97%   BMI 25.90 kg/m   Physical Examination: General: Well developed, well nourished, NAD  HEENT: OP clear, mucus membranes moist  SKIN: warm, dry. No rashes. Neuro: No focal deficits  Musculoskeletal: Muscle strength 5/5 all ext  Psychiatric: Mood and affect normal  Neck: No JVD, no carotid bruits, no thyromegaly, no lymphadenopathy.  Lungs:Clear bilaterally, no wheezes, rhonci, crackles Cardiovascular: Regular rate and rhythm. No murmurs, gallops or rubs. Abdomen:Soft. Bowel sounds present. Non-tender.  Extremities: No lower  extremity edema. Pulses are 2 + in the bilateral DP/PT.  EKG:  EKG is ordered today. The ekg ordered today demonstrates sinus, PAC  Echo 10/03/20: Left ventricle cavity is normal in size. Moderate concentric hypertrophy  of the left ventricle. Normal global wall motion. Normal LV systolic  function with visual EF 50-55%. Doppler evidence of grade I (impaired)  diastolic dysfunction, normal LAP.  Moderate calcification. Cannot exclude bicuspid aortic valve. Mild aortic  stenosis. Peak velocity 3 m/s, mean Gradient 18 mmHg, AVA 1.9 cm.  Moderate (Grade III) aortic regurgitation.  Mild (Grade I) mitral regurgitation.  Mild tricuspid regurgitation. Estimated pulmonary artery systolic pressure  24 mmHg.  Recent Labs: No results found for requested labs within last 8760 hours.   Lipid Panel No results found for: CHOL, TRIG, HDL, CHOLHDL, VLDL, LDLCALC, LDLDIRECT   Wt Readings from Last 3 Encounters:  11/16/20 191 lb (86.6 kg)  05/08/18 188 lb (85.3 kg)  04/03/18 188 lb (85.3 kg)     Assessment and Plan:   1. Aortic insufficiency/aortic stenosis: He has moderate AI and mild AS. He has no symptoms. Repeat echo in May 2021 here in our office.   2. CV risk assessment: He smoked for 50 years. No ischemia on EKG. No chest pain. Will arrange coronary calcium score for risk assessment. If his score is abnormal, will start a statin.   3. HTN: BP controlled.   Current medicines are reviewed at length with the patient today.  The patient does not have concerns regarding medicines.  The following changes have been made:  no change  Labs/ tests ordered today include:   Orders Placed This Encounter  Procedures  . CT CARDIAC SCORING (SELF PAY ONLY)  . EKG 12-Lead  . ECHOCARDIOGRAM COMPLETE     Disposition:   FU with me in 6 months.    Signed, Lauree Chandler, MD 11/16/2020 11:09 AM    [facility] [address], [place], [place]  [address] Phone: [phone]; Fax: [phone]

## 2020-11-16 NOTE — Patient Instructions (Signed)
Medication Instructions:  No changes *If you need a refill on your cardiac medications before your next appointment, please call your pharmacy*   Lab Work: none If you have labs (blood work) drawn today and your tests are completely normal, you will receive your results only by: Marland Kitchen MyChart Message (if you have MyChart) OR . A paper copy in the mail If you have any lab test that is abnormal or we need to change your treatment, we will call you to review the results.   Testing/Procedures: SCHEDULE ECHO MAY 2022 Your physician has requested that you have an echocardiogram. Echocardiography is a painless test that uses sound waves to create images of your heart. It provides your doctor with information about the size and shape of your heart and how well your heart's chambers and valves are working. This procedure takes approximately one hour. There are no restrictions for this procedure.  Calcium score CT --cost is $99. Not submitted to insurance    Follow-Up: At [facility], you and your health needs are our priority.  As part of our continuing mission to provide you with exceptional heart care, we have created designated Provider Care Teams.  These Care Teams include your primary Cardiologist (physician) and Advanced Practice Providers (APPs -  Physician Assistants and Nurse Practitioners) who all work together to provide you with the care you need, when you need it.  We recommend signing up for the patient portal called "MyChart".  Sign up information is provided on this After Visit Summary.  MyChart is used to connect with patients for Virtual Visits (Telemedicine).  Patients are able to view lab/test results, encounter notes, upcoming appointments, etc.  Non-urgent messages can be sent to your provider as well.   To learn more about what you can do with MyChart, go to [email or web address].    Your next appointment:   6 month(s)  The format for your next appointment:   In  Person  Provider:   You may see Lauree Chandler, MD or one of the following Advanced Practice Providers on your designated Care Team:    Melina Copa, PA-C  Ermalinda Barrios, PA-C    Other Instructions

## 2020-12-12 ENCOUNTER — Ambulatory Visit (INDEPENDENT_AMBULATORY_CARE_PROVIDER_SITE_OTHER)
Admission: RE | Admit: 2020-12-12 | Discharge: 2020-12-12 | Disposition: A | Discharge status: Home / Self Care | Payer: Self-pay | Source: Ambulatory Visit | Attending: Cardiovascular Disease | Admitting: Cardiovascular Disease

## 2020-12-12 ENCOUNTER — Other Ambulatory Visit: Payer: Self-pay

## 2020-12-12 DIAGNOSIS — I350 Nonrheumatic aortic (valve) stenosis: Secondary | ICD-10-CM

## 2020-12-18 ENCOUNTER — Telehealth: Payer: Medicare Other | Admitting: *Deleted

## 2020-12-18 DIAGNOSIS — I350 Nonrheumatic aortic (valve) stenosis: Secondary | ICD-10-CM

## 2020-12-18 MED ORDER — ROSUVASTATIN CALCIUM 5 MG PO TABS: 5.0000 mg | ORAL_TABLET | Freq: Every day | ORAL | 3 refills | Status: DC

## 2020-12-18 NOTE — Telephone Encounter (Signed)
Spoke with patient and reviewed results and recommendations.  He voices understanding.  Will pick up and begin Crestor.  Aware of follow up labs in 12 weeks, as well as cta and bmet and lfts now as well as echo scheduled in May.  He is aware to call if he experiences and chest pain or sob on exertion or at rest.

## 2020-12-18 NOTE — Telephone Encounter (Signed)
-----   Message from Burnell Blanks, MD sent at 12/13/2020 12:44 PM EST ----- His calcium score is abnormal suggesting hardening of the arteries. In the absence of chest pain or dyspnea, I would recommend continuing ASA and starting Crestor 5 mg daily. I would like to bring him in for a BMET and LFTs and repeat lipids and LFTs in 12 weeks. His scan cannot exclude an abnormality of the aorta/aortic arch. He will need a chest CTA to better evaluate the thoracic aorta. Gerald Stabs

## 2020-12-20 MED ORDER — ASPIRIN EC 81 MG PO TBEC: 81.0000 mg | DELAYED_RELEASE_TABLET | Freq: Every day | ORAL | 3 refills | Status: AC

## 2020-12-21 ENCOUNTER — Other Ambulatory Visit: Payer: Self-pay

## 2020-12-21 ENCOUNTER — Other Ambulatory Visit: Payer: Medicare Other | Admitting: *Deleted

## 2020-12-21 DIAGNOSIS — I350 Nonrheumatic aortic (valve) stenosis: Secondary | ICD-10-CM | POA: Diagnosis not present

## 2020-12-21 LAB — BASIC METABOLIC PANEL
BUN/Creatinine Ratio: 21 (ref 10–24)
BUN: 28 mg/dL — ABNORMAL HIGH (ref 8–27)
CO2: 24 mmol/L (ref 20–29)
Calcium: 9 mg/dL (ref 8.6–10.2)
Chloride: 102 mmol/L (ref 96–106)
Creatinine, Ser: 1.35 mg/dL — ABNORMAL HIGH (ref 0.76–1.27)
GFR calc Af Amer: 59 mL/min/{1.73_m2} — ABNORMAL LOW (ref >59)
GFR calc non Af Amer: 51 mL/min/{1.73_m2} — ABNORMAL LOW (ref >59)
Glucose: 127 mg/dL — ABNORMAL HIGH (ref 65–99)
Potassium: 4.8 mmol/L (ref 3.5–5.2)
Sodium: 137 mmol/L (ref 134–144)

## 2020-12-21 LAB — HEPATIC FUNCTION PANEL
ALT: 15 IU/L (ref 0–44)
AST: 18 IU/L (ref 0–40)
Albumin: 4.3 g/dL (ref 3.7–4.7)
Alkaline Phosphatase: 46 IU/L (ref 44–121)
Bilirubin Total: 0.6 mg/dL (ref 0.0–1.2)
Bilirubin, Direct: 0.18 mg/dL (ref 0.00–0.40)
Total Protein: 7.1 g/dL (ref 6.0–8.5)

## 2020-12-21 LAB — LIPID PANEL
Chol/HDL Ratio: 2.7 ratio (ref 0.0–5.0)
Cholesterol, Total: 129 mg/dL (ref 100–199)
HDL: 48 mg/dL (ref >39)
LDL Chol Calc (NIH): 65 mg/dL (ref 0–99)
Triglycerides: 84 mg/dL (ref 0–149)
VLDL Cholesterol Cal: 16 mg/dL (ref 5–40)

## 2020-12-26 ENCOUNTER — Ambulatory Visit (INDEPENDENT_AMBULATORY_CARE_PROVIDER_SITE_OTHER)
Admission: RE | Admit: 2020-12-26 | Discharge: 2020-12-26 | Disposition: A | Discharge status: Home / Self Care | Payer: Medicare Other | Source: Ambulatory Visit | Attending: Cardiovascular Disease | Admitting: Cardiovascular Disease

## 2020-12-26 ENCOUNTER — Other Ambulatory Visit: Payer: Self-pay

## 2020-12-26 DIAGNOSIS — I350 Nonrheumatic aortic (valve) stenosis: Secondary | ICD-10-CM

## 2020-12-26 DIAGNOSIS — I712 Thoracic aortic aneurysm, without rupture: Secondary | ICD-10-CM | POA: Diagnosis not present

## 2020-12-26 MED ORDER — IOHEXOL 350 MG/ML SOLN
100.0000 mL | Freq: Once | INTRAVENOUS | Status: AC | PRN
  Administered 2020-12-26: 100 mL via INTRAVENOUS

## 2020-12-27 ENCOUNTER — Telehealth: Payer: Self-pay | Admitting: *Deleted

## 2020-12-27 NOTE — Telephone Encounter (Signed)
Patient has been notified of results.  He has a follow up w Dr. Angelena Form scheduled 05/07/21 and will plan to schedule the next CTA at that time.  He does not wish to have a referral to CT surgery at this time.  He would like to "stay the course for now."

## 2020-12-27 NOTE — Telephone Encounter (Signed)
-----   Message from Burnell Blanks, MD sent at 12/27/2020 12:13 PM EST ----- Can we let Stephen Lang know that there is a slight irregularity in his aorta which may be a small ulcer or just an area of plaque buildup. I reviewed this with one of our CT surgeons. He will need a follow up CTA in 6 months to follow this. If the patient would prefer, we can refer him to see Dr. Cyndia Bent in [place] [facility] who can review it with him and follow it. THanks, chris

## 2021-02-20 DIAGNOSIS — M25561 Pain in right knee: Secondary | ICD-10-CM | POA: Insufficient documentation

## 2021-02-20 DIAGNOSIS — S76311A Strain of muscle, fascia and tendon of the posterior muscle group at thigh level, right thigh, initial encounter: Secondary | ICD-10-CM | POA: Diagnosis not present

## 2021-02-20 DIAGNOSIS — S76319A Strain of muscle, fascia and tendon of the posterior muscle group at thigh level, unspecified thigh, initial encounter: Secondary | ICD-10-CM | POA: Insufficient documentation

## 2021-02-20 DIAGNOSIS — M79651 Pain in right thigh: Secondary | ICD-10-CM | POA: Insufficient documentation

## 2021-03-06 DIAGNOSIS — S76311D Strain of muscle, fascia and tendon of the posterior muscle group at thigh level, right thigh, subsequent encounter: Secondary | ICD-10-CM | POA: Diagnosis not present

## 2021-03-14 ENCOUNTER — Other Ambulatory Visit: Payer: Self-pay

## 2021-03-14 ENCOUNTER — Other Ambulatory Visit: Payer: Medicare Other | Admitting: *Deleted

## 2021-03-14 DIAGNOSIS — E785 Hyperlipidemia, unspecified: Secondary | ICD-10-CM | POA: Diagnosis not present

## 2021-03-14 LAB — LIPID PANEL
Chol/HDL Ratio: 2.5 ratio (ref 0.0–5.0)
Cholesterol, Total: 108 mg/dL (ref 100–199)
HDL: 44 mg/dL (ref >39)
LDL Chol Calc (NIH): 47 mg/dL (ref 0–99)
Triglycerides: 87 mg/dL (ref 0–149)
VLDL Cholesterol Cal: 17 mg/dL (ref 5–40)

## 2021-03-14 LAB — HEPATIC FUNCTION PANEL
ALT: 18 IU/L (ref 0–44)
AST: 21 IU/L (ref 0–40)
Albumin: 4.2 g/dL (ref 3.7–4.7)
Alkaline Phosphatase: 41 IU/L — ABNORMAL LOW (ref 44–121)
Bilirubin Total: 0.5 mg/dL (ref 0.0–1.2)
Bilirubin, Direct: 0.16 mg/dL (ref 0.00–0.40)
Total Protein: 6.7 g/dL (ref 6.0–8.5)

## 2021-03-16 DIAGNOSIS — M79609 Pain in unspecified limb: Secondary | ICD-10-CM | POA: Diagnosis not present

## 2021-03-19 DIAGNOSIS — M79609 Pain in unspecified limb: Secondary | ICD-10-CM | POA: Diagnosis not present

## 2021-03-22 DIAGNOSIS — M79609 Pain in unspecified limb: Secondary | ICD-10-CM | POA: Diagnosis not present

## 2021-03-26 DIAGNOSIS — M25561 Pain in right knee: Secondary | ICD-10-CM | POA: Diagnosis not present

## 2021-04-05 DIAGNOSIS — M25561 Pain in right knee: Secondary | ICD-10-CM | POA: Diagnosis not present

## 2021-04-10 DIAGNOSIS — M25561 Pain in right knee: Secondary | ICD-10-CM | POA: Diagnosis not present

## 2021-04-13 DIAGNOSIS — H02881 Meibomian gland dysfunction right upper eyelid: Secondary | ICD-10-CM | POA: Diagnosis not present

## 2021-04-13 DIAGNOSIS — Z961 Presence of intraocular lens: Secondary | ICD-10-CM | POA: Diagnosis not present

## 2021-04-13 DIAGNOSIS — H02834 Dermatochalasis of left upper eyelid: Secondary | ICD-10-CM | POA: Diagnosis not present

## 2021-04-13 DIAGNOSIS — H02831 Dermatochalasis of right upper eyelid: Secondary | ICD-10-CM | POA: Diagnosis not present

## 2021-04-16 ENCOUNTER — Other Ambulatory Visit: Payer: Self-pay

## 2021-04-16 ENCOUNTER — Ambulatory Visit (HOSPITAL_COMMUNITY): Payer: Medicare Other | Attending: Cardiology

## 2021-04-16 DIAGNOSIS — I350 Nonrheumatic aortic (valve) stenosis: Secondary | ICD-10-CM

## 2021-04-16 DIAGNOSIS — M25561 Pain in right knee: Secondary | ICD-10-CM | POA: Diagnosis not present

## 2021-04-16 LAB — ECHOCARDIOGRAM COMPLETE
AR max vel: 1.46 cm2
AV Area VTI: 1.62 cm2
AV Area mean vel: 1.53 cm2
AV Mean grad: 18.4 mmHg
AV Peak grad: 29.2 mmHg
Ao pk vel: 2.7 m/s
Area-P 1/2: 2.6 cm2
P 1/2 time: 372 msec
S' Lateral: 2.9 cm

## 2021-05-07 ENCOUNTER — Ambulatory Visit: Payer: Medicare Other | Admitting: Cardiovascular Disease

## 2021-05-07 ENCOUNTER — Other Ambulatory Visit: Payer: Self-pay

## 2021-05-07 ENCOUNTER — Encounter: Payer: Self-pay | Admitting: Cardiovascular Disease

## 2021-05-07 VITALS — BP 136/60 | HR 65 | Ht 72.0 in | Wt 191.0 lb

## 2021-05-07 DIAGNOSIS — M79609 Pain in unspecified limb: Secondary | ICD-10-CM | POA: Diagnosis not present

## 2021-05-07 DIAGNOSIS — I2510 Atherosclerotic heart disease of native coronary artery without angina pectoris: Secondary | ICD-10-CM | POA: Diagnosis not present

## 2021-05-07 DIAGNOSIS — I350 Nonrheumatic aortic (valve) stenosis: Secondary | ICD-10-CM

## 2021-05-07 DIAGNOSIS — I700 Atherosclerosis of aorta: Secondary | ICD-10-CM | POA: Diagnosis not present

## 2021-05-07 DIAGNOSIS — I10 Essential (primary) hypertension: Secondary | ICD-10-CM

## 2021-05-07 NOTE — Progress Notes (Signed)
Chief Complaint  Patient presents with  . Follow-up    CAD   History of Present Illness: 75 yo male with history of HTN, prior tobacco abuse, anemia, COPD, CKD stage 2, BPH, aortic valve stenosis, mitral valve disease who is here today for follow up. I saw him as a new consult in December 2021 for the evaluation of abnormal echo with aortic stenosis and insufficiency. He had dizziness in November 2021. This has not recurred. Echo ordered in primary care 10/03/20 showed LVEF=50-55%, moderate LVH. Mild MR. Mild aortic stenosis with mean gradient 18 mmHg, AVA 1.9 cm2. There is moderate aortic regurgitation. He is very active. He had no chest pain, dyspnea, dizziness, LE edema, palpitations. He smoked 1ppd for 50 years and quit in 2013. He is retired English as a second language teacher. His wife is a retired Advertising account planner at [employer]. Echo May 2022 with LVEF=60-65%. Mild AI. Mild aortic stenosis with mean gradient 18 mmHg, AVA 1.6 cm2. Cardiac CT calcium score 2437. 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 I reviewed this with our CT surgery team and recommendations for follow up CTA in 6 months. He was started on ASA and a statin.   He is here today for follow up. The patient denies any chest pain, dyspnea, palpitations, lower extremity edema, orthopnea, PND, dizziness, near syncope or syncope.   Primary Care Physician: Haywood Pao, MD   Past Medical History:  Diagnosis Date  . Allergy    SEASONAL  . Anemia   . Aortic insufficiency   . Aortic stenosis   . BPH (benign prostatic hyperplasia)   . CKD (chronic kidney disease) stage 2, GFR 60-89 ml/min   . COPD (chronic obstructive pulmonary disease) ([facility])   . HTN (hypertension)   . Mitral regurgitation   . 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 APPENDECTOMY    . GUM SURGERY  2008    tumor right upper gum     Current Outpatient Medications  Medication Sig Dispense Refill  . aspirin EC 81 MG tablet Take 1 tablet (81 mg total) by mouth daily. Swallow whole. 90 tablet 3  . losartan (COZAAR) 50 MG tablet Take 1 tablet (50 mg total) by mouth daily. 90 tablet 3  . rosuvastatin (CRESTOR) 5 MG tablet Take 1 tablet (5 mg total) by mouth daily. 90 tablet 3  . traMADol (ULTRAM) 50 MG tablet Take 50 mg by mouth every 6 (six) hours as needed.     Current Facility-Administered Medications  Medication Dose Route Frequency Provider Last Rate Last Admin  . 0.9 %  sodium chloride infusion  500 mL Intravenous Once Ladene Artist, MD        Allergies  Allergen Reactions  . Midazolam Anaphylaxis    History of reaction to a similar family of this anesthesia  . Other Anaphylaxis    Innovar=respiratory arrest  . Droperidol Other (See Comments)    other    Social History   Socioeconomic History  . Marital status: Married    Spouse name: Not on file  . Number of children: 2  . Years of education: Not on file  . Highest education level: Not on file  Occupational History  . Occupation: Retired Futures trader  Tobacco Use  . Smoking status: Former Smoker    Packs/day: 1.00    Years: 50.00  Pack years: 50.00    Types: Cigarettes    Quit date: 12/03/2011    Years since quitting: 9.4  . Smokeless tobacco: Never Used  Substance and Sexual Activity  . Alcohol use: Yes    Alcohol/week: 0.0 standard drinks    Comment: wine monthly maybe per pt.   . Drug use: No  . Sexual activity: Not on file  Other Topics Concern  . Not on file  Social History Narrative  . Not on file   Social Determinants of Health   Financial Resource Strain: Not on file  Food Insecurity: Not on file  Transportation Needs: Not on file  Physical Activity: Not on file  Stress: Not on file  Social Connections: Not on file  Intimate Partner Violence: Not on file    Family History  Problem  Relation Age of Onset  . Colon cancer Maternal Aunt        age 14-60  . CAD Father   . Diabetes Brother     Review of Systems:  As stated in the HPI and otherwise negative.   BP 136/60   Pulse 65   Ht 6' (1.829 m)   Wt 191 lb (86.6 kg)   SpO2 98%   BMI 25.90 kg/m   Physical Examination: General: Well developed, well nourished, NAD  HEENT: OP clear, mucus membranes moist  SKIN: warm, dry. No rashes. Neuro: No focal deficits  Musculoskeletal: Muscle strength 5/5 all ext  Psychiatric: Mood and affect normal  Neck: No JVD, no carotid bruits, no thyromegaly, no lymphadenopathy.  Lungs:Clear bilaterally, no wheezes, rhonci, crackles Cardiovascular: Regular rate and rhythm. Systolic murmur noted.  Abdomen:Soft. Bowel sounds present. Non-tender.  Extremities: No lower extremity edema. Pulses are 2 + in the bilateral DP/PT.  EKG:  EKG is not ordered today. The ekg ordered today demonstrates   Echo 04/16/21: 1. Left ventricular ejection fraction, by estimation, is 60 to 65%. The  left ventricle has normal function. The left ventricle has no regional  wall motion abnormalities. There is mild left ventricular hypertrophy.  Left ventricular diastolic parameters  are consistent with Grade I diastolic dysfunction (impaired relaxation).  2. Right ventricular systolic function is normal. The right ventricular  size is mildly enlarged.  3. The mitral valve is normal in structure. No evidence of mitral valve  regurgitation. No evidence of mitral stenosis.  4. The aortic valve is normal in structure. There is moderate  calcification of the aortic valve. There is moderate thickening of the  aortic valve. Aortic valve regurgitation is mild. Mild aortic valve  stenosis. Aortic regurgitation PHT measures 372  msec. Aortic valve area, by VTI measures 1.62 cm. Aortic valve mean  gradient measures 18.4 mmHg. Aortic valve Vmax measures 2.70 m/s.  5. There is borderline dilatation of the  aortic root, measuring 40 mm.  There is borderline dilatation of the ascending aorta, measuring 38 mm.  6. The inferior vena cava is normal in size with greater than 50%  respiratory variability, suggesting right atrial pressure of 3 mmHg.   Recent Labs: 12/21/2020: BUN 28; Creatinine, Ser 1.35; Potassium 4.8; Sodium 137 03/14/2021: ALT 18   Lipid Panel    Component Value Date/Time   CHOL 108 03/14/2021 0732   TRIG 87 03/14/2021 0732   HDL 44 03/14/2021 0732   CHOLHDL 2.5 03/14/2021 0732   LDLCALC 47 03/14/2021 0732     Wt Readings from Last 3 Encounters:  05/07/21 191 lb (86.6 kg)  11/16/20 191 lb (86.6 kg)  05/08/18 188 lb (85.3 kg)     Assessment and Plan:   1. Aortic insufficiency/aortic stenosis: He has mild AI and mild AS. Repeat echo in May 2023.   2. CAD without angina: Abnormal calcium score.  CAD noted on chest CTA. No chest pain. Continue ASA and statin.    3. HTN: BP controlled. No changes  4. Ascending aorta ulceration: repeat CTA chest August 2022.    Current medicines are reviewed at length with the patient today.  The patient does not have concerns regarding medicines.  The following changes have been made:  no change  Labs/ tests ordered today include:   Orders Placed This Encounter  Procedures  . ECHOCARDIOGRAM COMPLETE     Disposition:   FU with me in 12 months.    Signed, Lauree Chandler, MD 05/08/2021 7:00 AM    [address], [place], [place]  [address] Phone: [phone]; Fax: [phone]

## 2021-05-07 NOTE — Patient Instructions (Signed)
Medication Instructions:  No changes *If you need a refill on your cardiac medications before your next appointment, please call your pharmacy*   Lab Work: none If you have labs (blood work) drawn today and your tests are completely normal, you will receive your results only by: Marland Kitchen MyChart Message (if you have MyChart) OR . A paper copy in the mail If you have any lab test that is abnormal or we need to change your treatment, we will call you to review the results.   Testing/Procedures: Your physician has requested that you have an echocardiogram. Echocardiography is a painless test that uses sound waves to create images of your heart. It provides your doctor with information about the size and shape of your heart and how well your heart's chambers and valves are working. This procedure takes approximately one hour. There are no restrictions for this procedure.   Follow-Up: At [facility], you and your health needs are our priority.  As part of our continuing mission to provide you with exceptional heart care, we have created designated Provider Care Teams.  These Care Teams include your primary Cardiologist (physician) and Advanced Practice Providers (APPs -  Physician Assistants and Nurse Practitioners) who all work together to provide you with the care you need, when you need it.   Your next appointment:   12 month(s)  The format for your next appointment:   In Person  Provider:   You may see Lauree Chandler, MD or one of the following Advanced Practice Providers on your designated Care Team:    Melina Copa, PA-C  Ermalinda Barrios, PA-C   Other Instructions We will plan for chest ct to look at your aorta for August 2022.  We will call you to schedule.

## 2021-05-09 DIAGNOSIS — S76311D Strain of muscle, fascia and tendon of the posterior muscle group at thigh level, right thigh, subsequent encounter: Secondary | ICD-10-CM | POA: Diagnosis not present

## 2021-06-08 DIAGNOSIS — N182 Chronic kidney disease, stage 2 (mild): Secondary | ICD-10-CM | POA: Diagnosis not present

## 2021-06-08 DIAGNOSIS — I1310 Hypertensive heart and chronic kidney disease without heart failure, with stage 1 through stage 4 chronic kidney disease, or unspecified chronic kidney disease: Secondary | ICD-10-CM | POA: Diagnosis not present

## 2021-06-08 DIAGNOSIS — Z125 Encounter for screening for malignant neoplasm of prostate: Secondary | ICD-10-CM | POA: Diagnosis not present

## 2021-06-15 DIAGNOSIS — R82998 Other abnormal findings in urine: Secondary | ICD-10-CM | POA: Diagnosis not present

## 2021-06-19 DIAGNOSIS — I700 Atherosclerosis of aorta: Secondary | ICD-10-CM

## 2021-06-29 ENCOUNTER — Other Ambulatory Visit: Payer: Medicare Other

## 2021-07-10 ENCOUNTER — Other Ambulatory Visit: Payer: Self-pay

## 2021-07-10 ENCOUNTER — Other Ambulatory Visit: Payer: Medicare Other

## 2021-07-10 DIAGNOSIS — I700 Atherosclerosis of aorta: Secondary | ICD-10-CM

## 2021-07-10 LAB — BASIC METABOLIC PANEL
BUN/Creatinine Ratio: 20 (ref 10–24)
BUN: 27 mg/dL (ref 8–27)
CO2: 20 mmol/L (ref 20–29)
Calcium: 8.7 mg/dL (ref 8.6–10.2)
Chloride: 106 mmol/L (ref 96–106)
Creatinine, Ser: 1.38 mg/dL — ABNORMAL HIGH (ref 0.76–1.27)
Glucose: 93 mg/dL (ref 65–99)
Potassium: 5.2 mmol/L (ref 3.5–5.2)
Sodium: 140 mmol/L (ref 134–144)
eGFR: 53 mL/min/{1.73_m2} — ABNORMAL LOW (ref >59)

## 2021-07-13 ENCOUNTER — Ambulatory Visit (INDEPENDENT_AMBULATORY_CARE_PROVIDER_SITE_OTHER)
Admission: RE | Admit: 2021-07-13 | Discharge: 2021-07-13 | Disposition: A | Discharge status: Home / Self Care | Payer: Medicare Other | Source: Ambulatory Visit | Attending: Cardiovascular Disease | Admitting: Cardiovascular Disease

## 2021-07-13 ENCOUNTER — Other Ambulatory Visit: Payer: Self-pay

## 2021-07-13 DIAGNOSIS — J439 Emphysema, unspecified: Secondary | ICD-10-CM | POA: Diagnosis not present

## 2021-07-13 DIAGNOSIS — I2510 Atherosclerotic heart disease of native coronary artery without angina pectoris: Secondary | ICD-10-CM | POA: Diagnosis not present

## 2021-07-13 DIAGNOSIS — J929 Pleural plaque without asbestos: Secondary | ICD-10-CM | POA: Diagnosis not present

## 2021-07-13 DIAGNOSIS — I700 Atherosclerosis of aorta: Secondary | ICD-10-CM | POA: Diagnosis not present

## 2021-07-13 DIAGNOSIS — I712 Thoracic aortic aneurysm, without rupture: Secondary | ICD-10-CM | POA: Diagnosis not present

## 2021-07-13 MED ORDER — IOHEXOL 350 MG/ML SOLN
100.0000 mL | Freq: Once | INTRAVENOUS | Status: AC | PRN
  Administered 2021-07-13: 100 mL via INTRAVENOUS

## 2021-07-26 DIAGNOSIS — N401 Enlarged prostate with lower urinary tract symptoms: Secondary | ICD-10-CM | POA: Diagnosis not present

## 2021-07-26 DIAGNOSIS — N138 Other obstructive and reflux uropathy: Secondary | ICD-10-CM | POA: Diagnosis not present

## 2021-09-17 NOTE — Telephone Encounter (Signed)
Called patient to review recommendations from PharmD.  Left VM that I would forward the recommendations to him in MyChart.

## 2021-10-08 MED ORDER — ROSUVASTATIN CALCIUM 5 MG PO TABS: 5.0000 mg | ORAL_TABLET | Freq: Every day | ORAL | 2 refills | Status: DC

## 2021-10-08 MED ORDER — LOSARTAN POTASSIUM 50 MG PO TABS
50.0000 mg | ORAL_TABLET | Freq: Every day | ORAL | 2 refills | Status: DC
Start: 2021-10-08 — End: 2022-07-30

## 2021-10-09 DIAGNOSIS — D225 Melanocytic nevi of trunk: Secondary | ICD-10-CM | POA: Diagnosis not present

## 2021-10-09 DIAGNOSIS — D692 Other nonthrombocytopenic purpura: Secondary | ICD-10-CM | POA: Diagnosis not present

## 2021-10-09 DIAGNOSIS — D485 Neoplasm of uncertain behavior of skin: Secondary | ICD-10-CM | POA: Diagnosis not present

## 2021-10-09 DIAGNOSIS — C44712 Basal cell carcinoma of skin of right lower limb, including hip: Secondary | ICD-10-CM | POA: Diagnosis not present

## 2021-10-09 DIAGNOSIS — L821 Other seborrheic keratosis: Secondary | ICD-10-CM | POA: Diagnosis not present

## 2021-10-19 DIAGNOSIS — H11153 Pinguecula, bilateral: Secondary | ICD-10-CM | POA: Diagnosis not present

## 2021-10-19 DIAGNOSIS — H1132 Conjunctival hemorrhage, left eye: Secondary | ICD-10-CM | POA: Diagnosis not present

## 2021-10-19 DIAGNOSIS — H0288A Meibomian gland dysfunction right eye, upper and lower eyelids: Secondary | ICD-10-CM | POA: Diagnosis not present

## 2021-10-20 ENCOUNTER — Emergency Department (HOSPITAL_BASED_OUTPATIENT_CLINIC_OR_DEPARTMENT_OTHER)
Admission: EM | Admit: 2021-10-20 | Discharge: 2021-10-20 | Disposition: A | Discharge status: Home / Self Care | Payer: Medicare Other | Attending: Student | Admitting: Student

## 2021-10-20 ENCOUNTER — Emergency Department (HOSPITAL_BASED_OUTPATIENT_CLINIC_OR_DEPARTMENT_OTHER): Payer: Medicare Other

## 2021-10-20 ENCOUNTER — Other Ambulatory Visit: Payer: Self-pay

## 2021-10-20 ENCOUNTER — Encounter (HOSPITAL_BASED_OUTPATIENT_CLINIC_OR_DEPARTMENT_OTHER): Payer: Self-pay | Admitting: *Deleted

## 2021-10-20 DIAGNOSIS — J449 Chronic obstructive pulmonary disease, unspecified: Secondary | ICD-10-CM | POA: Insufficient documentation

## 2021-10-20 DIAGNOSIS — N182 Chronic kidney disease, stage 2 (mild): Secondary | ICD-10-CM | POA: Diagnosis not present

## 2021-10-20 DIAGNOSIS — Z87891 Personal history of nicotine dependence: Secondary | ICD-10-CM | POA: Diagnosis not present

## 2021-10-20 DIAGNOSIS — Z7982 Long term (current) use of aspirin: Secondary | ICD-10-CM | POA: Diagnosis not present

## 2021-10-20 DIAGNOSIS — Y92007 Garden or yard of unspecified non-institutional (private) residence as the place of occurrence of the external cause: Secondary | ICD-10-CM | POA: Insufficient documentation

## 2021-10-20 DIAGNOSIS — W010XXA Fall on same level from slipping, tripping and stumbling without subsequent striking against object, initial encounter: Secondary | ICD-10-CM | POA: Insufficient documentation

## 2021-10-20 DIAGNOSIS — Z79899 Other long term (current) drug therapy: Secondary | ICD-10-CM | POA: Diagnosis not present

## 2021-10-20 DIAGNOSIS — Y9389 Activity, other specified: Secondary | ICD-10-CM | POA: Diagnosis not present

## 2021-10-20 DIAGNOSIS — W19XXXA Unspecified fall, initial encounter: Secondary | ICD-10-CM

## 2021-10-20 DIAGNOSIS — I129 Hypertensive chronic kidney disease with stage 1 through stage 4 chronic kidney disease, or unspecified chronic kidney disease: Secondary | ICD-10-CM | POA: Diagnosis not present

## 2021-10-20 DIAGNOSIS — S42022A Displaced fracture of shaft of left clavicle, initial encounter for closed fracture: Secondary | ICD-10-CM | POA: Diagnosis not present

## 2021-10-20 DIAGNOSIS — S42032A Displaced fracture of lateral end of left clavicle, initial encounter for closed fracture: Secondary | ICD-10-CM | POA: Insufficient documentation

## 2021-10-20 DIAGNOSIS — S4992XA Unspecified injury of left shoulder and upper arm, initial encounter: Secondary | ICD-10-CM | POA: Diagnosis present

## 2021-10-20 MED ORDER — OXYCODONE-ACETAMINOPHEN 5-325 MG PO TABS
1.0000 | ORAL_TABLET | Freq: Once | ORAL | Status: AC
  Administered 2021-10-20: 1 via ORAL
  Filled 2021-10-20: qty 1

## 2021-10-20 MED ORDER — OXYCODONE HCL 5 MG PO TABS: 5.0000 mg | ORAL_TABLET | ORAL | 0 refills | Status: DC | PRN

## 2021-10-20 NOTE — ED Notes (Signed)
Pt d/c home per MD order. Discharge summary reviewed, verbalize understanding. No s/s of acute distress noted at discharge. Pt wife is discharge ride home.

## 2021-10-20 NOTE — ED Provider Notes (Signed)
[facility] EMERGENCY DEPT Provider Note   CSN: [id number] Arrival date & time: 10/20/21  1458     History Chief Complaint  Patient presents with   Stephen Lang is a 75 y.o. male with PMH aortic stenosis, CKD, COPD, HTN who presents emergency department for evaluation of a fall.  Patient states that he was blowing leaves and tripped over the curb landing onto an outstretched left arm.  He did not strike his head or lose consciousness.  He is not on blood thinners.  He arrives with an obvious deformity to the left clavicle with very mild skin tenting and no overlying skin discoloration.  Denies numbness, tingling, weakness of the upper extremity.  Patient denies chest pain, shortness of breath, abdominal pain with nausea, vomiting or other systemic or traumatic complaints.   Fall Pertinent negatives include no chest pain, no abdominal pain and no shortness of breath.      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   Blood in the urine 07/13/2012    Past Surgical History:  Procedure Laterality Date   APPENDECTOMY     GUM SURGERY  2008   tumor right upper gum        Family History  Problem Relation Age of Onset   Colon cancer Maternal Aunt        age 33-60   CAD Father    Diabetes Brother     Social History   Tobacco Use   Smoking status: Former    Packs/day: 1.00    Years: 50.00    Pack years: 50.00    Types: Cigarettes    Quit  date: 12/03/2011    Years since quitting: 9.8   Smokeless tobacco: Never  Vaping Use   Vaping Use: Never used  Substance Use Topics   Alcohol use: Yes    Alcohol/week: 0.0 standard drinks    Comment: wine monthly maybe per pt.    Drug use: No    Home Medications Prior to Admission medications   Medication Sig Start Date End Date Taking? Authorizing Provider  aspirin EC 81 MG tablet Take 1 tablet (81 mg total) by mouth daily. Swallow whole. 12/20/20  Yes Burnell Blanks, MD  losartan (COZAAR) 50 MG tablet Take 1 tablet (50 mg total) by mouth daily. 10/08/21  Yes Burnell Blanks, MD  oxyCODONE (ROXICODONE) 5 MG immediate release tablet Take 1 tablet (5 mg total) by mouth every 4 (four) hours as needed for severe pain. 10/20/21  Yes Courtenay Hirth, MD  rosuvastatin (CRESTOR) 5 MG tablet Take 1 tablet (5 mg total) by mouth daily. 10/08/21  Yes Burnell Blanks, MD    Allergies    Midazolam, Other, and Droperidol  Review of Systems   Review of Systems  Constitutional:  Negative for chills and fever.  HENT:  Negative for ear pain and sore throat.   Eyes:  Negative for pain and visual disturbance.  Respiratory:  Negative for cough and shortness of breath.   Cardiovascular:  Negative for chest pain and palpitations.  Gastrointestinal:  Negative for abdominal pain and vomiting.  Genitourinary:  Negative for dysuria and hematuria.  Musculoskeletal:  Positive for arthralgias. Negative for back pain.  Skin:  Negative for color change and rash.  Neurological:  Negative for seizures and syncope.  All other systems reviewed and are negative.  Physical Exam Updated Vital Signs BP (!) 156/66   Pulse 82   Temp 97.8 F (36.6 C)   Resp 16   Ht 6' (1.829 m)   Wt 88.5 kg   SpO2 100%   BMI 26.45 kg/m   Physical Exam Vitals and nursing note reviewed.  Constitutional:      General: He is not in acute distress.    Appearance: He is well-developed.  HENT:     Head:  Normocephalic and atraumatic.  Eyes:     Conjunctiva/sclera: Conjunctivae normal.  Cardiovascular:     Rate and Rhythm: Normal rate and regular rhythm.     Heart sounds: No murmur heard. Pulmonary:     Effort: Pulmonary effort is normal. No respiratory distress.     Breath sounds: Normal breath sounds.  Abdominal:     Palpations: Abdomen is soft.     Tenderness: There is no abdominal tenderness.  Musculoskeletal:        General: Swelling and tenderness (Left clavicle) present.     Cervical back: Neck supple.  Skin:    General: Skin is warm and dry.     Capillary Refill: Capillary refill takes less than 2 seconds.  Neurological:     Mental Status: He is alert.  Psychiatric:        Mood and Affect: Mood normal.    ED Results / Procedures / Treatments   Labs (all labs ordered are listed, but only abnormal results are displayed) Labs Reviewed - No data to display  EKG None  Radiology No results found.  Procedures Procedures   Medications Ordered in ED Medications  oxyCODONE-acetaminophen (PERCOCET/ROXICET) 5-325 MG per tablet 1 tablet (1 tablet Oral Given 10/20/21 1609)    ED Course  I have reviewed the triage vital signs and the nursing notes.  Pertinent labs & imaging results that were available during my care of the patient were reviewed by me and considered in my medical decision making (see chart for details).    MDM Rules/Calculators/A&P                           Patient seen emergency department for evaluation of a fall with a suspected left clavicle injury.  Physical exam reveals an obvious deformity to the distal end of the left clavicle with mild skin tenting but no skin discoloration, numbness, tingling or weakness of the extremity.  X-ray imaging showing a distal clavicle fracture.  Patient currently already has an orthopedist that he is seeing for other orthopedic complaints and he will follow-up with this provider but resources provided for [facility]  just in case.  He was instructed to take Tylenol for pain control and a short prescription for oxycodone was provided for breakthrough pain.  He was given a sling for stabilization and comfort and the patient was discharged with orthopedic follow-up. Final Clinical Impression(s) / ED Diagnoses Final diagnoses:  Closed displaced fracture of acromial end of left clavicle, initial encounter    Rx / DC  Orders ED Discharge Orders          Ordered    oxyCODONE (ROXICODONE) 5 MG immediate release tablet  Every 4 hours PRN        10/20/21 1654             Heith Haigler, Debe Coder, MD 10/20/21 1657

## 2021-10-20 NOTE — Discharge Instructions (Addendum)
You are seen the emergency department for evaluation of a fall.  Your x-rays show a clavicle fracture on the left today.  Please call your orthopedist for follow-up next week and keep your sling on during the day for comfort.  It is important that you schedule I 1000 mg Tylenol for breakfast lunch and dinner (3 times daily) for pain control.  If you have persistent pain, short prescription for oxycodone is available for breakthrough pain only.  Please note that this can increase her risk of falling and it is not safe to drive a vehicle while taking this medication.  At this time you are safe for discharge but return the emergency department if you have a discoloration over the fracture site, fever, numbness, tingling, weakness of the arm or any other concerning symptoms.

## 2021-10-20 NOTE — ED Triage Notes (Signed)
[address] today while working on this yard.  Complaint of left shoulder pain.

## 2021-10-23 ENCOUNTER — Telehealth: Payer: Self-pay | Admitting: *Deleted

## 2021-10-23 DIAGNOSIS — M25512 Pain in left shoulder: Secondary | ICD-10-CM | POA: Diagnosis not present

## 2021-10-23 DIAGNOSIS — M79642 Pain in left hand: Secondary | ICD-10-CM | POA: Diagnosis not present

## 2021-10-23 DIAGNOSIS — S42002A Fracture of unspecified part of left clavicle, initial encounter for closed fracture: Secondary | ICD-10-CM | POA: Insufficient documentation

## 2021-10-23 DIAGNOSIS — S42022A Displaced fracture of shaft of left clavicle, initial encounter for closed fracture: Secondary | ICD-10-CM | POA: Diagnosis not present

## 2021-10-23 NOTE — Telephone Encounter (Signed)
   Pre-operative Risk Assessment    Patient Name: Stephen Lang  DOB: [birth year] MRN: [id number]      Request for Surgical Clearance   Procedure:   ORIF LEFT CLAVICLE   Date of Surgery: Clearance 11/02/21                                 Surgeon:  DR. Victorino December Surgeon's Group or Practice Name:  [facility] Phone number:  [phone] Fax number:  [phone] ATTN: KERRI MAZE   Type of Clearance Requested: - Medical  - Pharmacy:  Hold Aspirin     Type of Anesthesia:   CHOICE   Additional requests/questions:   Jiles Prows   10/23/2021, 2:59 PM

## 2021-10-23 NOTE — Telephone Encounter (Signed)
Left message to call back and ask to speak with the preop team.  Darreld Mclean, PA-C 10/23/2021 3:39 PM

## 2021-10-24 NOTE — Telephone Encounter (Signed)
    Patient Name: IVAAN LIDDY  DOB: [birth year] MRN: [id number]  Primary Cardiologist: Lauree Chandler, MD  Chart reviewed as part of pre-operative protocol coverage. Patient was last seen by Dr. Angelena Form in 05/2021 at which time he was doing well from a cardiac standpoint. He was contacted today for additional pre-op evaluation. He has done well from a cardiac standpoint since his last visit. He denies any chest pain, shortness of breath, orthopnea/PND, palpitations, syncope. He is able to complete >4.0 METS of activity. Given past medical history and time since last visit, based on ACC/AHA guidelines, MARWAN LIPE would be at acceptable risk for the planned procedure without further cardiovascular testing.   Patient is on Aspirin for elevated coronary calcium score. He has never had a MI or stroke. Therefore, OK to hold Aspirin as needed prior to surgery. Please restart this as soon as safely possible afterwards.   I will route this recommendation to the requesting party via Epic fax function and remove from pre-op pool.  Please call with questions.  Darreld Mclean, PA-C 10/24/2021, 9:21 AM

## 2021-10-29 ENCOUNTER — Other Ambulatory Visit: Payer: Self-pay | Admitting: Cardiovascular Disease

## 2021-10-30 NOTE — Telephone Encounter (Signed)
Outpatient Medication Detail   Disp Refills Start End   losartan (COZAAR) 50 MG tablet 90 tablet 2 10/08/2021    Sig - Route: Take 1 tablet (50 mg total) by mouth daily. - Oral   Sent to pharmacy as: losartan (COZAAR) 50 MG tablet   E-Prescribing Status: Receipt confirmed by pharmacy (10/08/2021  8:18 AM EST)    Pharmacy  [id number] - [place], [place]. AT [place]

## 2021-11-01 ENCOUNTER — Other Ambulatory Visit: Payer: Self-pay

## 2021-11-01 ENCOUNTER — Encounter (HOSPITAL_COMMUNITY): Payer: Self-pay | Admitting: Orthopedic Surgery

## 2021-11-01 NOTE — Progress Notes (Signed)
Mr. Penado denies chest pain or shortness of breath.  Patient denies having any s/s of Covid in his household.  Patient denies any known exposure to Covid.   Mr. Gorby PCP is Dr. Charlann Boxer.  Cardiologist is Dr. Angelena Form.  Patient was given cardiac clearance by cardiologist office, notes give permission for patiemnto hold ASA if Dr. Stann Mainland needs patient to. Mr. Kiraly was not given any instructions.  I instructed patient to hold ASA in am.  I instructed Mr. Putzier to shower with antibiotic soap, if it is available.  Dry off with a clean towel. Do not put lotion, powder, cologne or deodorant or makeup.No jewelry or piercings. Men may shave their face and neck. Woman should not shave. No nail polish, artificial or acrylic nails. Wear clean clothes, brush your teeth. Glasses, contact lens,dentures or partials may not be worn in the OR. If you need to wear them, please bring a case for glasses, do not wear contacts or bring a case, the hospital does not have contact cases, dentures or partials will have to be removed , make sure they are clean, we will provide a denture cup to put them in. You will need some one to drive you home and a responsible person over the age of 55 to stay with you for the first 24 hours after surgery.

## 2021-11-02 ENCOUNTER — Encounter (HOSPITAL_COMMUNITY): Payer: Self-pay | Admitting: Orthopedic Surgery

## 2021-11-02 ENCOUNTER — Ambulatory Visit (HOSPITAL_COMMUNITY): Payer: Medicare Other | Admitting: Anesthesiology

## 2021-11-02 ENCOUNTER — Ambulatory Visit (HOSPITAL_COMMUNITY): Payer: Medicare Other

## 2021-11-02 ENCOUNTER — Encounter (HOSPITAL_COMMUNITY)
Admission: RE | Disposition: A | Discharge status: Home / Self Care | Payer: Self-pay | Source: Home / Self Care | Attending: Orthopedic Surgery

## 2021-11-02 ENCOUNTER — Ambulatory Visit (HOSPITAL_COMMUNITY)
Admission: RE | Admit: 2021-11-02 | Discharge: 2021-11-02 | Disposition: A | Discharge status: Home / Self Care | Payer: Medicare Other | Attending: Orthopedic Surgery | Admitting: Orthopedic Surgery

## 2021-11-02 DIAGNOSIS — N182 Chronic kidney disease, stage 2 (mild): Secondary | ICD-10-CM | POA: Diagnosis not present

## 2021-11-02 DIAGNOSIS — S42022A Displaced fracture of shaft of left clavicle, initial encounter for closed fracture: Secondary | ICD-10-CM | POA: Insufficient documentation

## 2021-11-02 DIAGNOSIS — Z87891 Personal history of nicotine dependence: Secondary | ICD-10-CM | POA: Insufficient documentation

## 2021-11-02 DIAGNOSIS — I129 Hypertensive chronic kidney disease with stage 1 through stage 4 chronic kidney disease, or unspecified chronic kidney disease: Secondary | ICD-10-CM | POA: Diagnosis not present

## 2021-11-02 DIAGNOSIS — Z9889 Other specified postprocedural states: Secondary | ICD-10-CM | POA: Diagnosis not present

## 2021-11-02 DIAGNOSIS — I351 Nonrheumatic aortic (valve) insufficiency: Secondary | ICD-10-CM | POA: Diagnosis not present

## 2021-11-02 DIAGNOSIS — Z419 Encounter for procedure for purposes other than remedying health state, unspecified: Secondary | ICD-10-CM

## 2021-11-02 DIAGNOSIS — W19XXXA Unspecified fall, initial encounter: Secondary | ICD-10-CM | POA: Diagnosis not present

## 2021-11-02 HISTORY — DX: Cardiac murmur, unspecified: R01.1

## 2021-11-02 HISTORY — PX: ORIF CLAVICULAR FRACTURE: SHX5055

## 2021-11-02 HISTORY — DX: Other complications of anesthesia, initial encounter: T88.59XA

## 2021-11-02 LAB — BASIC METABOLIC PANEL
Anion gap: 9 (ref 5–15)
BUN: 33 mg/dL — ABNORMAL HIGH (ref 8–23)
CO2: 23 mmol/L (ref 22–32)
Calcium: 9.4 mg/dL (ref 8.9–10.3)
Chloride: 104 mmol/L (ref 98–111)
Creatinine, Ser: 1.34 mg/dL — ABNORMAL HIGH (ref 0.61–1.24)
GFR, Estimated: 55 mL/min — ABNORMAL LOW (ref >60)
Glucose, Bld: 92 mg/dL (ref 70–99)
Potassium: 5.2 mmol/L — ABNORMAL HIGH (ref 3.5–5.1)
Sodium: 136 mmol/L (ref 135–145)

## 2021-11-02 LAB — CBC
HCT: 40.8 % (ref 39.0–52.0)
Hemoglobin: 13.4 g/dL (ref 13.0–17.0)
MCH: 30.5 pg (ref 26.0–34.0)
MCHC: 32.8 g/dL (ref 30.0–36.0)
MCV: 92.7 fL (ref 80.0–100.0)
Platelets: 238 10*3/uL (ref 150–400)
RBC: 4.4 MIL/uL (ref 4.22–5.81)
RDW: 12.9 % (ref 11.5–15.5)
WBC: 11.6 10*3/uL — ABNORMAL HIGH (ref 4.0–10.5)
nRBC: 0 % (ref 0.0–0.2)

## 2021-11-02 SURGERY — OPEN REDUCTION INTERNAL FIXATION (ORIF) CLAVICULAR FRACTURE
Anesthesia: General | Site: Chest | Laterality: Left

## 2021-11-02 MED ORDER — ONDANSETRON HCL 4 MG PO TABS: 4.0000 mg | ORAL_TABLET | Freq: Every day | ORAL | 1 refills | Status: AC | PRN

## 2021-11-02 MED ORDER — FENTANYL CITRATE (PF) 250 MCG/5ML IJ SOLN
INTRAMUSCULAR | Status: AC
  Filled 2021-11-02: qty 5

## 2021-11-02 MED ORDER — OXYCODONE HCL 5 MG PO TABS
5.0000 mg | ORAL_TABLET | Freq: Once | ORAL | Status: AC | PRN
  Administered 2021-11-02: 5 mg via ORAL

## 2021-11-02 MED ORDER — DEXMEDETOMIDINE (PRECEDEX) IN NS 20 MCG/5ML (4 MCG/ML) IV SYRINGE
PREFILLED_SYRINGE | INTRAVENOUS | Status: DC | PRN
  Administered 2021-11-02: 12 ug via INTRAVENOUS

## 2021-11-02 MED ORDER — LIDOCAINE 2% (20 MG/ML) 5 ML SYRINGE
INTRAMUSCULAR | Status: DC | PRN
  Administered 2021-11-02: 60 mg via INTRAVENOUS

## 2021-11-02 MED ORDER — EPHEDRINE 5 MG/ML INJ
INTRAVENOUS | Status: AC
  Filled 2021-11-02: qty 5

## 2021-11-02 MED ORDER — ONDANSETRON HCL 4 MG/2ML IJ SOLN
INTRAMUSCULAR | Status: AC
  Filled 2021-11-02: qty 2

## 2021-11-02 MED ORDER — 0.9 % SODIUM CHLORIDE (POUR BTL) OPTIME
TOPICAL | Status: DC | PRN
  Administered 2021-11-02: 1000 mL

## 2021-11-02 MED ORDER — ONDANSETRON HCL 4 MG/2ML IJ SOLN
INTRAMUSCULAR | Status: DC | PRN
  Administered 2021-11-02: 4 mg via INTRAVENOUS

## 2021-11-02 MED ORDER — PROPOFOL 10 MG/ML IV BOLUS
INTRAVENOUS | Status: DC | PRN
  Administered 2021-11-02: 40 mg via INTRAVENOUS
  Administered 2021-11-02: 20 mg via INTRAVENOUS
  Administered 2021-11-02: 140 mg via INTRAVENOUS

## 2021-11-02 MED ORDER — SUGAMMADEX SODIUM 200 MG/2ML IV SOLN
INTRAVENOUS | Status: DC | PRN
  Administered 2021-11-02: 200 mg via INTRAVENOUS

## 2021-11-02 MED ORDER — BUPIVACAINE-EPINEPHRINE (PF) 0.25% -1:200000 IJ SOLN
INTRAMUSCULAR | Status: AC
  Filled 2021-11-02: qty 30

## 2021-11-02 MED ORDER — OXYCODONE HCL 5 MG PO TABS
ORAL_TABLET | ORAL | Status: AC
  Filled 2021-11-02: qty 1

## 2021-11-02 MED ORDER — EPHEDRINE SULFATE-NACL 50-0.9 MG/10ML-% IV SOSY
PREFILLED_SYRINGE | INTRAVENOUS | Status: DC | PRN
  Administered 2021-11-02: 10 mg via INTRAVENOUS

## 2021-11-02 MED ORDER — ROCURONIUM BROMIDE 10 MG/ML (PF) SYRINGE
PREFILLED_SYRINGE | INTRAVENOUS | Status: DC | PRN
  Administered 2021-11-02: 60 mg via INTRAVENOUS
  Administered 2021-11-02: 40 mg via INTRAVENOUS

## 2021-11-02 MED ORDER — LACTATED RINGERS IV SOLN: INTRAVENOUS | Status: DC

## 2021-11-02 MED ORDER — ROCURONIUM BROMIDE 10 MG/ML (PF) SYRINGE
PREFILLED_SYRINGE | INTRAVENOUS | Status: AC
  Filled 2021-11-02: qty 10

## 2021-11-02 MED ORDER — LIDOCAINE 2% (20 MG/ML) 5 ML SYRINGE
INTRAMUSCULAR | Status: AC
  Filled 2021-11-02: qty 5

## 2021-11-02 MED ORDER — PROPOFOL 10 MG/ML IV BOLUS
INTRAVENOUS | Status: AC
  Filled 2021-11-02: qty 20

## 2021-11-02 MED ORDER — DEXAMETHASONE SODIUM PHOSPHATE 10 MG/ML IJ SOLN
INTRAMUSCULAR | Status: AC
  Filled 2021-11-02: qty 1

## 2021-11-02 MED ORDER — ACETAMINOPHEN 500 MG PO TABS: 1000.0000 mg | ORAL_TABLET | Freq: Once | ORAL | Status: DC | PRN

## 2021-11-02 MED ORDER — PHENYLEPHRINE 40 MCG/ML (10ML) SYRINGE FOR IV PUSH (FOR BLOOD PRESSURE SUPPORT)
PREFILLED_SYRINGE | INTRAVENOUS | Status: DC | PRN
  Administered 2021-11-02: 40 ug via INTRAVENOUS
  Administered 2021-11-02: 120 ug via INTRAVENOUS

## 2021-11-02 MED ORDER — OXYCODONE HCL 5 MG PO TABS: 5.0000 mg | ORAL_TABLET | ORAL | 0 refills | Status: AC | PRN

## 2021-11-02 MED ORDER — FENTANYL CITRATE (PF) 250 MCG/5ML IJ SOLN
INTRAMUSCULAR | Status: DC | PRN
  Administered 2021-11-02: 50 ug via INTRAVENOUS
  Administered 2021-11-02: 150 ug via INTRAVENOUS
  Administered 2021-11-02: 50 ug via INTRAVENOUS

## 2021-11-02 MED ORDER — CEFAZOLIN SODIUM-DEXTROSE 2-4 GM/100ML-% IV SOLN
2.0000 g | INTRAVENOUS | Status: AC
  Administered 2021-11-02: 2 g via INTRAVENOUS
  Filled 2021-11-02: qty 100

## 2021-11-02 MED ORDER — CHLORHEXIDINE GLUCONATE 0.12 % MT SOLN
15.0000 mL | Freq: Once | OROMUCOSAL | Status: AC
  Administered 2021-11-02: 15 mL via OROMUCOSAL
  Filled 2021-11-02: qty 15

## 2021-11-02 MED ORDER — ORAL CARE MOUTH RINSE: 15.0000 mL | Freq: Once | OROMUCOSAL | Status: AC

## 2021-11-02 MED ORDER — ACETAMINOPHEN 10 MG/ML IV SOLN
INTRAVENOUS | Status: AC
  Filled 2021-11-02: qty 100

## 2021-11-02 MED ORDER — ACETAMINOPHEN 160 MG/5ML PO SOLN: 1000.0000 mg | Freq: Once | ORAL | Status: DC | PRN

## 2021-11-02 MED ORDER — OXYCODONE HCL 5 MG/5ML PO SOLN: 5.0000 mg | Freq: Once | ORAL | Status: AC | PRN

## 2021-11-02 MED ORDER — DEXAMETHASONE SODIUM PHOSPHATE 10 MG/ML IJ SOLN
INTRAMUSCULAR | Status: DC | PRN
  Administered 2021-11-02: 10 mg via INTRAVENOUS

## 2021-11-02 MED ORDER — BUPIVACAINE-EPINEPHRINE 0.25% -1:200000 IJ SOLN
INTRAMUSCULAR | Status: DC | PRN
  Administered 2021-11-02: 30 mL

## 2021-11-02 MED ORDER — PHENYLEPHRINE HCL-NACL 20-0.9 MG/250ML-% IV SOLN
INTRAVENOUS | Status: DC | PRN
  Administered 2021-11-02: 30 ug/min via INTRAVENOUS

## 2021-11-02 MED ORDER — ACETAMINOPHEN 10 MG/ML IV SOLN: 1000.0000 mg | Freq: Once | INTRAVENOUS | Status: DC | PRN

## 2021-11-02 MED ORDER — FENTANYL CITRATE (PF) 100 MCG/2ML IJ SOLN: 25.0000 ug | INTRAMUSCULAR | Status: DC | PRN

## 2021-11-02 MED ORDER — ACETAMINOPHEN 10 MG/ML IV SOLN
INTRAVENOUS | Status: DC | PRN
  Administered 2021-11-02: 1000 mg via INTRAVENOUS

## 2021-11-02 SURGICAL SUPPLY — 57 items
BAG COUNTER SPONGE SURGICOUNT (BAG) ×2 IMPLANT
BAG SPNG CNTER NS LX DISP (BAG) ×1
BIT DRILL QC 2-FLUTE 2X110 (BIT) ×1 IMPLANT
BIT DRILL QC 2X140 (BIT) ×1 IMPLANT
CANISTER SUCT 3000ML PPV (MISCELLANEOUS) ×2 IMPLANT
COVER SURGICAL LIGHT HANDLE (MISCELLANEOUS) ×2 IMPLANT
DRAPE C-ARM 42X72 X-RAY (DRAPES) ×2 IMPLANT
DRAPE ORTHO SPLIT 87X125 STRL (DRAPES) ×1 IMPLANT
DRAPE U-SHAPE 47X51 STRL (DRAPES) ×2 IMPLANT
DRSG AQUACEL AG ADV 3.5X10 (GAUZE/BANDAGES/DRESSINGS) ×1 IMPLANT
DRSG TEGADERM 4X4.75 (GAUZE/BANDAGES/DRESSINGS) ×2 IMPLANT
DURAPREP 26ML APPLICATOR (WOUND CARE) ×2 IMPLANT
ELECT CAUTERY BLADE 6.4 (BLADE) ×2 IMPLANT
ELECT REM PT RETURN 9FT ADLT (ELECTROSURGICAL) ×2
ELECTRODE REM PT RTRN 9FT ADLT (ELECTROSURGICAL) ×1 IMPLANT
GAUZE XEROFORM 1X8 LF (GAUZE/BANDAGES/DRESSINGS) ×2 IMPLANT
GLOVE SRG 8 PF TXTR STRL LF DI (GLOVE) ×2 IMPLANT
GLOVE SURG ENC MOIS LTX SZ7.5 (GLOVE) ×4 IMPLANT
GLOVE SURG UNDER POLY LF SZ8 (GLOVE) ×4
GOWN STRL REUS W/ TWL LRG LVL3 (GOWN DISPOSABLE) ×1 IMPLANT
GOWN STRL REUS W/TWL LRG LVL3 (GOWN DISPOSABLE) ×2
GOWN STRL REUS W/TWL XL LVL3 (GOWN DISPOSABLE) ×4 IMPLANT
KIT BASIN OR (CUSTOM PROCEDURE TRAY) ×2 IMPLANT
KIT TURNOVER KIT B (KITS) ×2 IMPLANT
MANIFOLD NEPTUNE II (INSTRUMENTS) ×2 IMPLANT
NDL HYPO 25GX1X1/2 BEV (NEEDLE) IMPLANT
NEEDLE HYPO 25GX1X1/2 BEV (NEEDLE) IMPLANT
NS IRRIG 1000ML POUR BTL (IV SOLUTION) ×2 IMPLANT
PACK SHOULDER (CUSTOM PROCEDURE TRAY) ×2 IMPLANT
PAD ARMBOARD 7.5X6 YLW CONV (MISCELLANEOUS) ×4 IMPLANT
PLATE CLAV VA LCP CS2 2.7 LT (Plate) ×1 IMPLANT
SCREW CORT ST 2.7X12 (Screw) ×1 IMPLANT
SCREW CORT ST 2.7X14 (Screw) ×1 IMPLANT
SCREW LOCK 2.7X16MM (Screw) ×2 IMPLANT
SCREW LOCK ST 2.7X12 (Screw) ×8 IMPLANT
SCREW LOCK ST 2.7X14 (Screw) ×6 IMPLANT
SCREW LOCK T8 12X2.7XST VA (Screw) IMPLANT
SCREW LOCK T8 14X2.7XST VA (Screw) IMPLANT
SCREW LOCK T8 16X2.7XST VA (Screw) IMPLANT
STAPLER VISISTAT 35W (STAPLE) IMPLANT
STRIP CLOSURE SKIN 1/2X4 (GAUZE/BANDAGES/DRESSINGS) ×2 IMPLANT
SUCTION FRAZIER HANDLE 10FR (MISCELLANEOUS) ×2
SUCTION TUBE FRAZIER 10FR DISP (MISCELLANEOUS) ×1 IMPLANT
SUT FIBERWIRE #2 38 REV NDL BL (SUTURE) ×2
SUT MNCRL AB 3-0 PS2 18 (SUTURE) ×1 IMPLANT
SUT MNCRL AB 4-0 PS2 18 (SUTURE) ×2 IMPLANT
SUT VIC AB 0 CT1 27 (SUTURE) ×2
SUT VIC AB 0 CT1 27XBRD ANBCTR (SUTURE) ×1 IMPLANT
SUT VIC AB 1 CT1 27 (SUTURE) ×2
SUT VIC AB 1 CT1 27XBRD ANBCTR (SUTURE) IMPLANT
SUT VIC AB 2-0 CT1 27 (SUTURE) ×8
SUT VIC AB 2-0 CT1 TAPERPNT 27 (SUTURE) ×1 IMPLANT
SUTURE FIBERWR#2 38 REV NDL BL (SUTURE) IMPLANT
SYR CONTROL 10ML LL (SYRINGE) IMPLANT
UNDERPAD 30X36 HEAVY ABSORB (UNDERPADS AND DIAPERS) ×2 IMPLANT
WIRE 1.6MMX150MM (WIRE) ×1 IMPLANT
YANKAUER SUCT BULB TIP NO VENT (SUCTIONS) ×2 IMPLANT

## 2021-11-02 NOTE — Transfer of Care (Signed)
Immediate Anesthesia Transfer of Care Note  Patient: Stephen Lang  Procedure(s) Performed: OPEN REDUCTION INTERNAL FIXATION (ORIF) CLAVICULAR FRACTURE (Left: Chest)  Patient Location: PACU  Anesthesia Type:General  Level of Consciousness: lethargic and responds to stimulation  Airway & Oxygen Therapy: Patient Spontanous Breathing and Patient connected to nasal cannula oxygen  Post-op Assessment: Report given to RN  Post vital signs: Reviewed and stable  Last Vitals:  Vitals Value Taken Time  BP 159/63 11/02/21 1717  Temp    Pulse 77 11/02/21 1718  Resp 12 11/02/21 1718  SpO2 100 % 11/02/21 1718  Vitals shown include unvalidated device data.  Last Pain:  Vitals:   11/02/21 1304  TempSrc:   PainSc: 0-No pain         Complications: No notable events documented.

## 2021-11-02 NOTE — Brief Op Note (Signed)
11/02/2021  5:19 PM  PATIENT:  Stephen Lang  75 y.o. male  PRE-OPERATIVE DIAGNOSIS:  Left displaced midshaft clavicle fracture  POST-OPERATIVE DIAGNOSIS:  Left displaced midshaft clavicle fracture  PROCEDURE:  Procedure(s): OPEN REDUCTION INTERNAL FIXATION (ORIF) CLAVICULAR FRACTURE (Left)  SURGEON:  Surgeon(s) and Role:    * Nicholes Stairs, MD - Primary  PHYSICIAN ASSISTANT: Jonelle Sidle, PA-C   ANESTHESIA:   local and general  EBL:  75 mL   BLOOD ADMINISTERED:none  DRAINS: none   LOCAL MEDICATIONS USED:  MARCAINE     SPECIMEN:  No Specimen  DISPOSITION OF SPECIMEN:  N/A  COUNTS:  YES  TOURNIQUET:  * No tourniquets in log *  DICTATION: .Note written in EPIC  PLAN OF CARE: Discharge to home after PACU  PATIENT DISPOSITION:  PACU - hemodynamically stable.   Delay start of Pharmacological VTE agent (>24hrs) due to surgical blood loss or risk of bleeding: not applicable

## 2021-11-02 NOTE — Anesthesia Procedure Notes (Signed)
Procedure Name: Intubation Date/Time: 11/02/2021 3:24 PM Performed by: Vonna Drafts, CRNA Pre-anesthesia Checklist: Patient identified, Emergency Drugs available, Suction available and Patient being monitored Patient Re-evaluated:Patient Re-evaluated prior to induction Oxygen Delivery Method: Circle system utilized Preoxygenation: Pre-oxygenation with 100% oxygen Induction Type: IV induction Ventilation: Mask ventilation without difficulty Laryngoscope Size: Mac and 4 Grade View: Grade I Tube type: Oral Number of attempts: 1 Airway Equipment and Method: Stylet Placement Confirmation: ETT inserted through vocal cords under direct vision, positive ETCO2 and breath sounds checked- equal and bilateral Secured at: 23 cm Tube secured with: Tape Dental Injury: Teeth and Oropharynx as per pre-operative assessment

## 2021-11-02 NOTE — Op Note (Signed)
   Date of Surgery: 11/02/2021  INDICATIONS: Mr. Granholm is a 75 y.o.-year-old male who sustained a left closed and comminuted midshaft clavicle fracture with tenting of the skin;  The patient did consent to the procedure after discussion of the risks and benefits.  PREOPERATIVE DIAGNOSIS: Left midshaft clavicle fracture  POSTOPERATIVE DIAGNOSIS: Same.  PROCEDURE: Open treatment of left clavicle fracture with internal fixation  SURGEON: Geralynn Rile, M.D.  ASSIST: Jonelle Sidle, PA-C  Assistant attestation:  PA Mcclung was present for the entire procedure.  He participated in all portions..  ANESTHESIA:  general, 1/4% plain Marcaine with epinephrine  IV FLUIDS AND URINE: See anesthesia.  ESTIMATED BLOOD LOSS: minimal mL.  IMPLANTS: Synthes left-sided anatomic clavicle plate with 10 total screws.  4 locking on either side of the fracture and 1 compression screw on either side of the fracture.  COMPLICATIONS: None.  DESCRIPTION OF PROCEDURE: The patient was brought to the operating room and placed supine on the operating table.  The patient had been signed prior to the procedure and this was documented. The patient had the anesthesia placed by the anesthesiologist.  The patient was then placed in the beach chair position.  All bony prominences were well padded.  A time-out was performed to confirm that this was the correct patient, site, side and location. The patient had an SCD on the lower extremities. The patient did receive antibiotics prior to the incision and was re-dosed during the procedure as needed at indicated intervals.  The patient had the operative extremity prepped and draped in the standard surgical fashion.    A horizontal incision based over the clavicle was used.  Cutaneous nerves were identified and protected as much as possible.  Full thickness flaps were elevated off of the clavicle.  The fracture was exposed.  Any organized hematoma and entrapped periosteum was  retrieved from the fracture site. Reduction was obtained using clamps and a superior plate was applied to the clavicle.  This fracture had significant anterior butterfly comminution and therefore was only a short segment of posterior cortical clavicle to hold the reduction.  This was able to be held only once the plate was aligned as there was no ability to clamp that due to the orientation of the fracture.  We then utilized the butterfly fragments to as autograft into the anterior void. the appropriate length was found by using fluoroscopy.  With the plate in the appropriate position and the fracture reduced.  Nonlocking screws were placed through the plate and into the clavicle.  Care was taken not to plunge with any of the instruments.  Retractors were used as added protection to the neurovascular and pulmonary structures.  Final fluoroscopy pictures were taken to confirm plate placement and fracture reduction.  The wound was thoroughly irrigated and closed in a layer fashion using 0 vicryl, 2.0 vicryl and running subcuticular 3-0 Monocryl with Steri-Strips.  Sterile dressings were applied and the patient was extubated and transferred to the PACU in stable condition.  POSTOPERATIVE PLAN: Patient will be in a sling for comfort.  He is allowed to range his shoulder up to the level of the shoulder and not allowed to lift anything.  We will plan for discharge home from PACU.

## 2021-11-02 NOTE — H&P (Signed)
ORTHOPAEDIC H and P  REQUESTING PHYSICIAN: Nicholes Stairs, MD  PCP:  Haywood Pao, MD  Chief Complaint: Left clavicle fracture  HPI: Stephen Lang is a 75 y.o. male who complains of left clavicle pain and deformity overlying the left clavicle.  He sustained a fall that resulted in the above fracture.  He was seen in the office and evaluated and found to have significantly displaced and comminuted fracture with tenting of the skin and concern for conversion to open injury.  He is here today for operative management.  No new complaints.  He is in a sling.  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   Past Surgical History:  Procedure Laterality Date   APPENDECTOMY     COLONOSCOPY W/ POLYPECTOMY     GUM SURGERY  12/02/2006   tumor right upper gum    Social History   Socioeconomic History   Marital status: Married    Spouse name: Not on file   Number of children: 2   Years of education: Not on file   Highest education level: Not on file  Occupational History   Occupation: Retired Futures trader  Tobacco Use   Smoking status: Former    Packs/day: 1.00    Years: 50.00    Pack years: 50.00    Types: Cigarettes    Quit date: 12/03/2011    Years since quitting: 9.9   Smokeless tobacco: Never  Vaping Use   Vaping Use: Never used  Substance and Sexual Activity   Alcohol use: Yes    Alcohol/week: 2.0 standard drinks    Types: 2 Glasses of wine per week   Drug use: No   Sexual activity: Not on file  Other Topics Concern   Not on file  Social History Narrative   Not on file   Social Determinants  of Health   Financial Resource Strain: Not on file  Food Insecurity: Not on file  Transportation Needs: Not on file  Physical Activity: Not on file  Stress: Not on file  Social Connections: Not on file   Family History  Problem Relation Age of Onset   Colon cancer Maternal Aunt        age 60-60   CAD Father    Diabetes Brother    Allergies  Allergen Reactions   Midazolam Anaphylaxis    History of reaction to a similar family of this anesthesia   Other Anaphylaxis    Innovar=respiratory arrest   Droperidol Other (See Comments)    Unsure of allergy    Prior to Admission medications   Medication Sig Start Date End Date Taking? Authorizing Provider  acetaminophen (TYLENOL) 500 MG tablet Take 1,000 mg by mouth every 8 (eight) hours as needed for moderate pain.   Yes [provider]  aspirin EC 81 MG tablet Take 1 tablet (81 mg total) by mouth daily. Swallow whole. 12/20/20  Yes Burnell Blanks, MD  losartan (COZAAR) 50 MG tablet Take 1 tablet (50 mg total) by mouth daily. 10/08/21  Yes Burnell Blanks, MD  oxyCODONE (ROXICODONE) 5 MG immediate release tablet Take 1 tablet (5 mg total) by mouth every 4 (four) hours as needed for severe pain. 10/20/21  Yes Kommor, Madison, MD  rosuvastatin (CRESTOR) 5 MG tablet Take 1 tablet (5 mg total) by mouth daily. 10/08/21  Yes Burnell Blanks, MD   No results found.  Positive ROS: All other systems have been reviewed and were otherwise negative with the exception of those mentioned in the HPI and as above.  Physical Exam: General: Alert, no acute distress Cardiovascular: No pedal edema Respiratory: No cyanosis, no use of accessory musculature GI: No organomegaly, abdomen is soft and non-tender Skin: No lesions in the area of chief complaint Neurologic: Sensation intact distally Psychiatric: Patient is competent for consent with normal mood and affect Lymphatic: No axillary or cervical  lymphadenopathy  MUSCULOSKELETAL: Left shoulder:  He has obvious deformity of the midshaft with tenting of the skin but no open wound.  Mild bruising.  Moderate pain there.  Along the shoulder and arm is neurovascular intact with no open wounds.  Assessment: Left closed midshaft clavicle fracture with comminution  Plan: Plan today is to proceed with open reduction internal fixation of this clavicle fracture.  We discussed the risk of bleeding, infection, damage to surrounding nerves and vessels, nonunion, malunion, painful hardware, stiff shoulder, and the risk of anesthesia.  He has provided informed consent.  -Plan for discharge home postoperatively from PACU.    Nicholes Stairs, MD Cell [phone]    11/02/2021 2:40 PM

## 2021-11-02 NOTE — Discharge Instructions (Signed)
Orthopedic surgery discharge instructions:  -Maintain postoperative bandages until your follow-up appointment.  This is a waterproof bandage and you may begin showering but leaving the bandage in place beginning on postoperative day #2.  Do not submerge underwater.  -Maintain your arm in the sling for comfort.  Do not lift the arm above shoulder height but she can begin using the arm for activities of daily living beginning immediately after surgery.  No lifting over 2 pounds.  Otherwise he should sleep in the sling.  -Apply ice liberally to the left shoulder at the surgical site for 20 to 30 minutes out of each hour.  She do this around-the-clock as often as possible.  -For mild to moderate pain use Tylenol and Advil in alternating fashion every 6 hours respectively.  -For breakthrough pain use oxycodone as necessary.  -Return to see Dr. Stann Mainland in the office in 2 weeks for routine postop check.

## 2021-11-02 NOTE — Anesthesia Preprocedure Evaluation (Signed)
Anesthesia Evaluation  Patient identified by MRN, date of birth, ID band Patient awake    Reviewed: Allergy & Precautions, NPO status , Patient's Chart, lab work & pertinent test results  History of Anesthesia Complications Negative for: history of anesthetic complications  Airway Mallampati: II  TM Distance: >3 FB Neck ROM: Full    Dental  (+) Dental Advisory Given, Teeth Intact   Pulmonary neg shortness of breath, neg sleep apnea, COPD, neg recent URI, former smoker,    breath sounds clear to auscultation       Cardiovascular hypertension, Pt. on medications  Rhythm:Regular  1. Left ventricular ejection fraction, by estimation, is 60 to 65%. The  left ventricle has normal function. The left ventricle has no regional  wall motion abnormalities. There is mild left ventricular hypertrophy.  Left ventricular diastolic parameters  are consistent with Grade I diastolic dysfunction (impaired relaxation).  2. Right ventricular systolic function is normal. The right ventricular  size is mildly enlarged.  3. The mitral valve is normal in structure. No evidence of mitral valve  regurgitation. No evidence of mitral stenosis.  4. The aortic valve is normal in structure. There is moderate  calcification of the aortic valve. There is moderate thickening of the  aortic valve. Aortic valve regurgitation is mild. Mild aortic valve  stenosis. Aortic regurgitation PHT measures 372  msec. Aortic valve area, by VTI measures 1.62 cm. Aortic valve mean  gradient measures 18.4 mmHg. Aortic valve Vmax measures 2.70 m/s.  5. There is borderline dilatation of the aortic root, measuring 40 mm.  There is borderline dilatation of the ascending aorta, measuring 38 mm.  6. The inferior vena cava is normal in size with greater than 50%  respiratory variability, suggesting right atrial pressure of 3 mmHg.    Neuro/Psych negative neurological ROS   negative psych ROS   GI/Hepatic negative GI ROS, Neg liver ROS,   Endo/Other  negative endocrine ROS  Renal/GU Renal InsufficiencyRenal diseaseLab Results      Component                Value               Date                      CREATININE               1.34 (H)            11/02/2021           Lab Results      Component                Value               Date                      K                        5.2 (H)             11/02/2021                Musculoskeletal Left displaced midshaft clavicle fracture   Abdominal   Peds  Hematology negative hematology ROS (+) Lab Results      Component                Value  Date                      WBC                      11.6 (H)            11/02/2021                HGB                      13.4                11/02/2021                HCT                      40.8                11/02/2021                MCV                      92.7                11/02/2021                PLT                      238                 11/02/2021              Anesthesia Other Findings   Reproductive/Obstetrics                             Anesthesia Physical Anesthesia Plan  ASA: 2  Anesthesia Plan: General   Post-op Pain Management: Ofirmev IV (intra-op)   Induction: Intravenous  PONV Risk Score and Plan: 2 and Ondansetron and Dexamethasone  Airway Management Planned: Oral ETT  Additional Equipment: None  Intra-op Plan:   Post-operative Plan: Extubation in OR  Informed Consent: I have reviewed the patients History and Physical, chart, labs and discussed the procedure including the risks, benefits and alternatives for the proposed anesthesia with the patient or authorized representative who has indicated his/her understanding and acceptance.     Dental advisory given  Plan Discussed with: CRNA and Anesthesiologist  Anesthesia Plan Comments:         Anesthesia Quick Evaluation

## 2021-11-03 NOTE — Anesthesia Postprocedure Evaluation (Signed)
Anesthesia Post Note  Patient: AIDYN KELLIS  Procedure(s) Performed: OPEN REDUCTION INTERNAL FIXATION (ORIF) CLAVICULAR FRACTURE (Left: Chest)     Patient location during evaluation: PACU Anesthesia Type: General Level of consciousness: awake and alert Pain management: pain level controlled Vital Signs Assessment: post-procedure vital signs reviewed and stable Respiratory status: spontaneous breathing, nonlabored ventilation, respiratory function stable and patient connected to nasal cannula oxygen Cardiovascular status: blood pressure returned to baseline and stable Postop Assessment: no apparent nausea or vomiting Anesthetic complications: no   No notable events documented.  Last Vitals:  Vitals:   11/02/21 1747 11/02/21 1802  BP: (!) 143/62 (!) 147/65  Pulse: 79 74  Resp: 19 11  Temp:  36.7 C  SpO2: 100% 98%    Last Pain:  Vitals:   11/02/21 1755  TempSrc:   PainSc: 4                  Jeraline Marcinek

## 2021-11-06 ENCOUNTER — Encounter (HOSPITAL_COMMUNITY): Payer: Self-pay | Admitting: Orthopedic Surgery

## 2021-11-11 IMAGING — CT CT ANGIO CHEST
3 of 8 series · 19 of 46 positions shown · IV contrast (OMNIPAQUE 350)
Comparison: 12/12/2020 CT cardiac calcium study

CLINICAL DATA: Hypertension.  Thoracic aortic aneurysm suspected

EXAM:
CT ANGIOGRAPHY CHEST WITH CONTRAST
TECHNIQUE: Multidetector CT imaging of the chest was performed using the
standard protocol during bolus administration of intravenous
contrast. Multiplanar CT image reconstructions and MIPs were
obtained to evaluate the vascular anatomy.
CONTRAST:  100mL OMNIPAQUE IOHEXOL 350 MG/ML SOLN

[Series 4: aorta 3.0 bf37 2 · axial · 0.72mm/px · z∈[-192,+96]mm · 13 of 113 slices shown]
[im 9/113  lung]
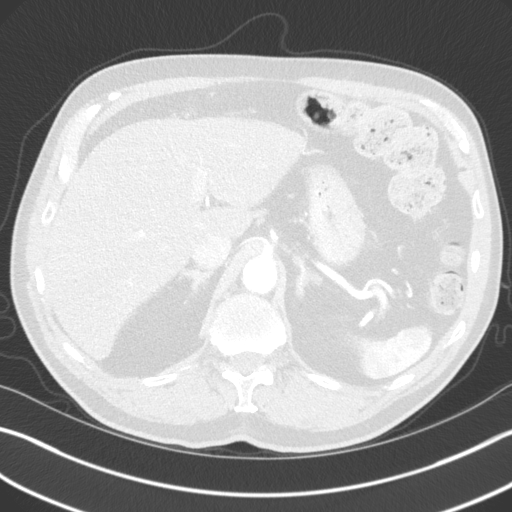
[im 17/113  soft-tissue]
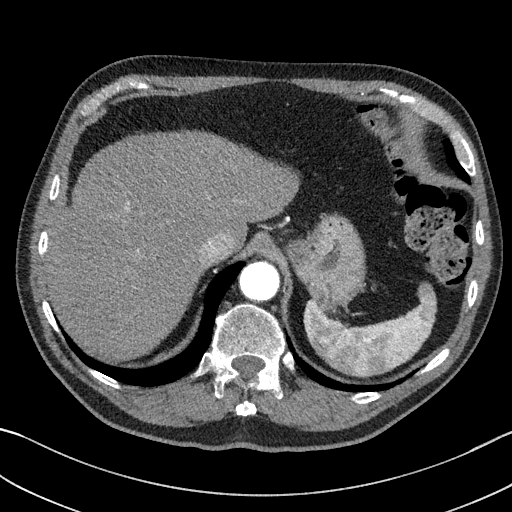
[im 25/113  lung]
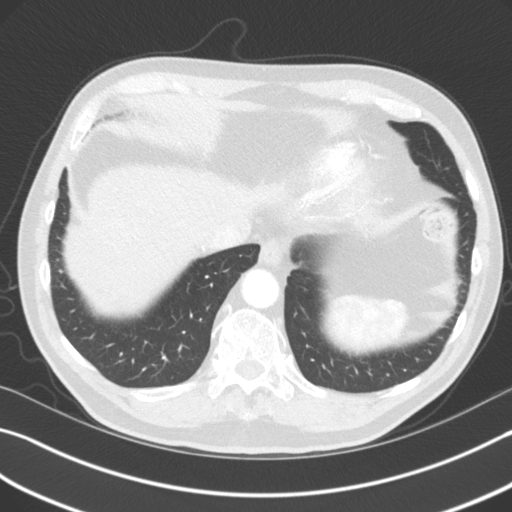
[im 33/113  soft-tissue]
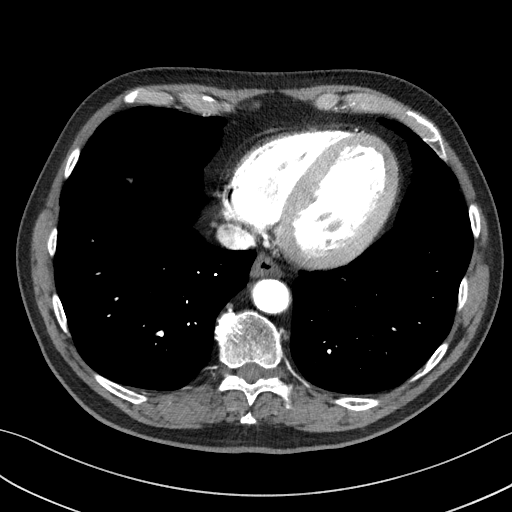
[im 41/113  lung]
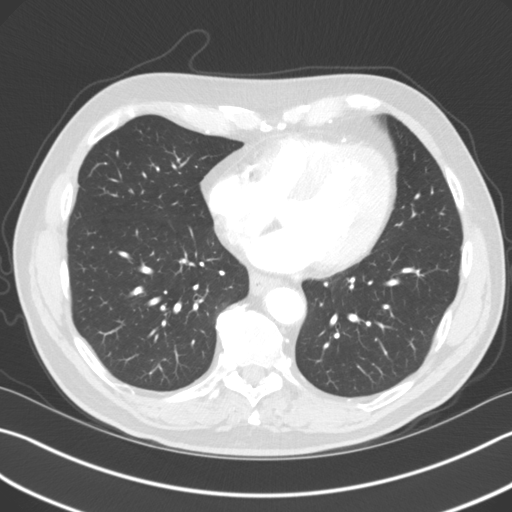
[im 49/113  soft-tissue]
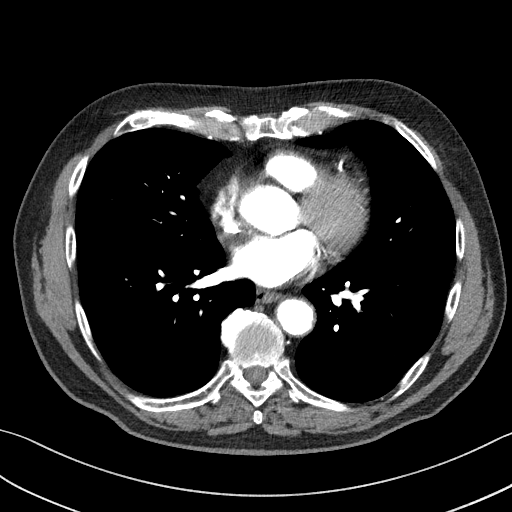
[im 57/113  lung]
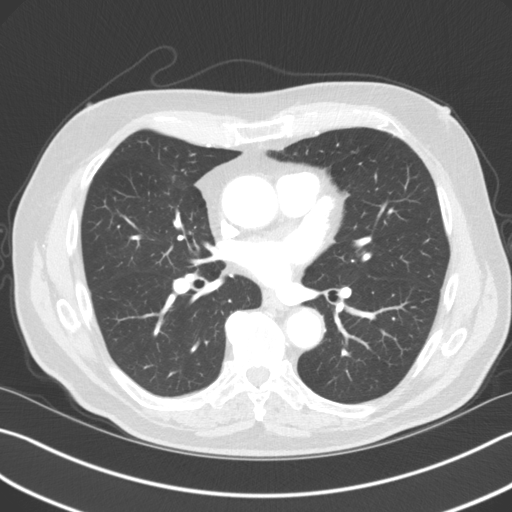
[im 65/113  soft-tissue]
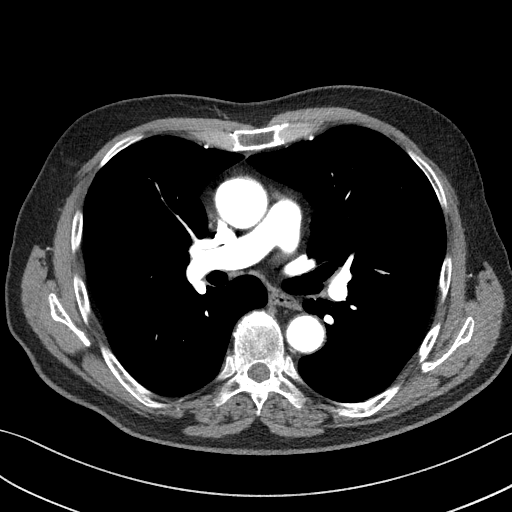
[im 73/113  lung]
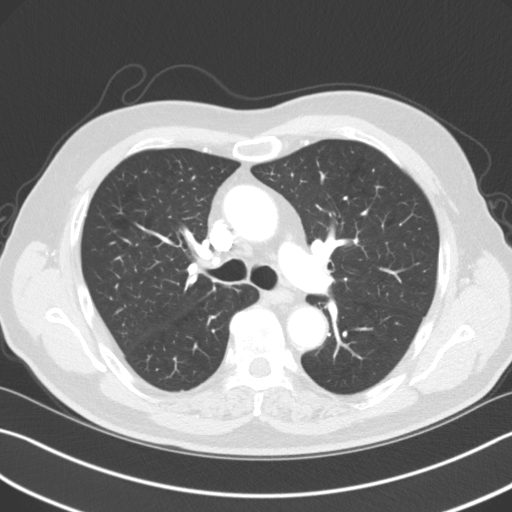
[im 81/113  soft-tissue]
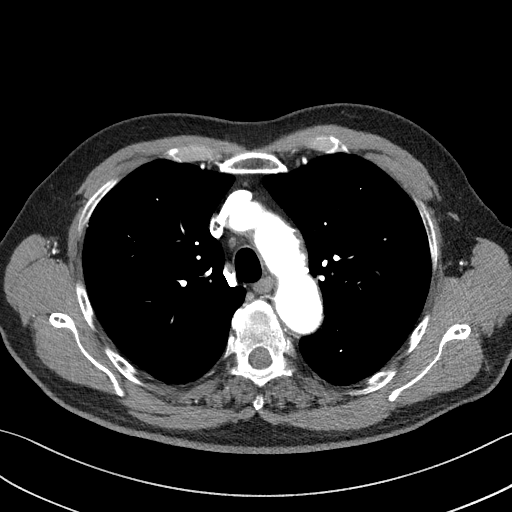
[im 89/113  lung]
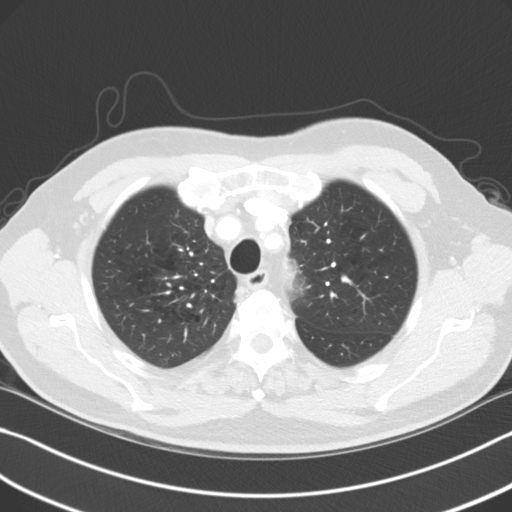
[im 97/113  soft-tissue]
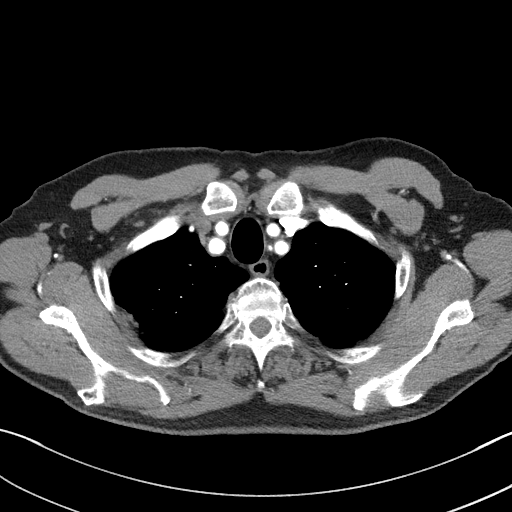
[im 105/113  lung]
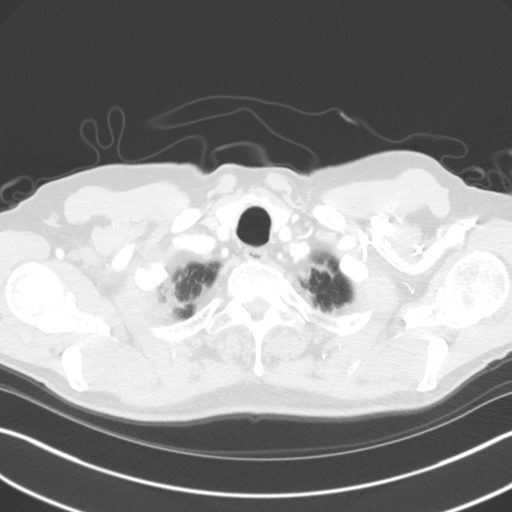

[Series 5: lung · axial · 0.72mm/px · z∈[-192,-96]mm · 3 of 113 slices shown]
[im 9/113  soft-tissue]
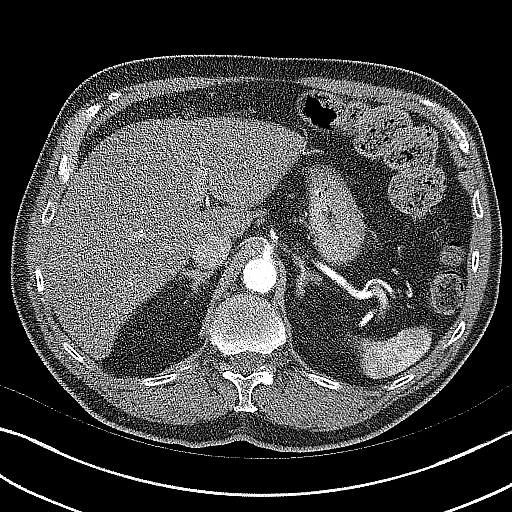
[im 25/113  soft-tissue]
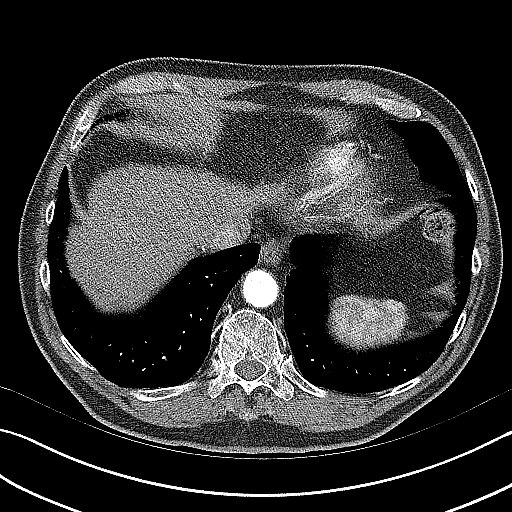
[im 41/113  soft-tissue]
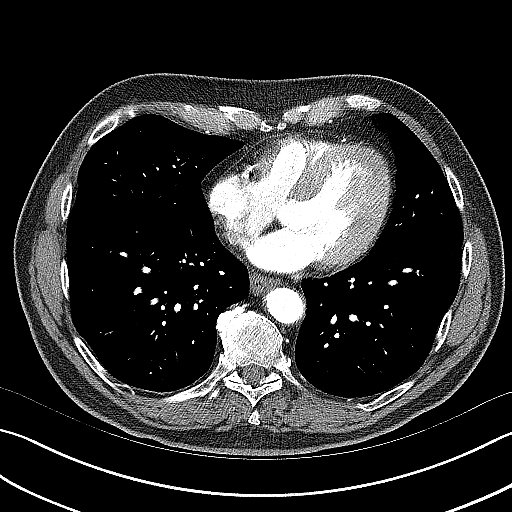

[Series 7: coronals · coronal · 0.67mm/px · 3 of 137 slices shown]
[im 35/137  soft-tissue]
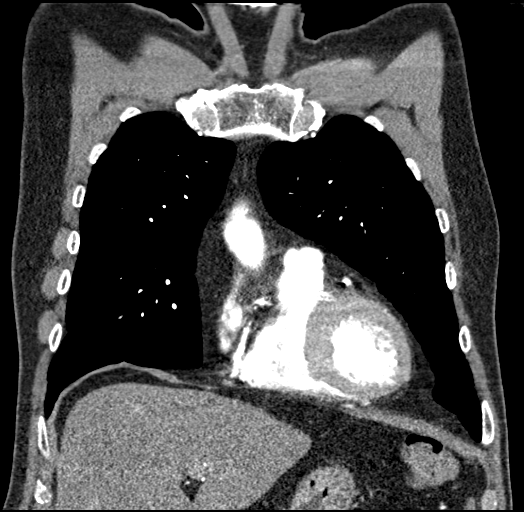
[im 69/137  soft-tissue]
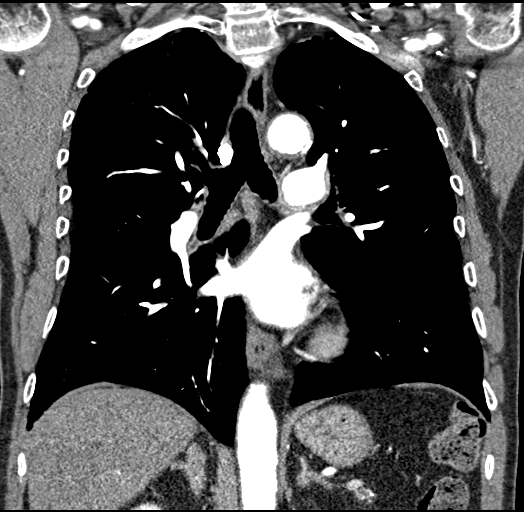
[im 103/137  soft-tissue]
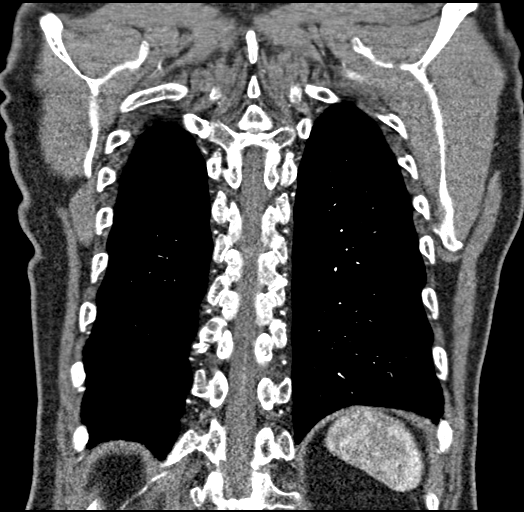

[19 of 46 positions shown; findings below may reference images not displayed]

FINDINGS: Cardiovascular: Irregular calcified and noncalcified plaque within
the aortic arch and descending thoracic aorta. There is a small out
pouching of contrast noted along the inferior aspect of the proximal
aortic arch seen on sagittal image 90 and axial image 37, series 4.
This may reflect penetrating ulcer. No evidence of aortic aneurysm.
Maximum aortic diameter 3.5 cm in the ascending thoracic aorta. No
dissection. Heart is normal size. Coronary artery calcifications
throughout the major coronary vessels.

Mediastinum/Nodes: No mediastinal, hilar, or axillary adenopathy.
Trachea and esophagus are unremarkable. Thyroid unremarkable.

Lungs/Pleura: Biapical scarring. Mild centrilobular emphysema. No
confluent opacities or effusions.

Upper Abdomen: Imaging into the upper abdomen demonstrates no acute
findings.

Musculoskeletal: Chest wall soft tissues are unremarkable. No acute
bony abnormality.

Review of the MIP images confirms the above findings.
IMPRESSION: Irregular calcified and noncalcified plaque within the aortic arch
and descending thoracic aorta. Suspect small penetrating ulcer along
the inferior proximal aortic arch.

Coronary artery disease.

No acute cardiopulmonary disease.

Aortic Atherosclerosis (YUGW4-01A.A) and Emphysema (YUGW4-HA4.7).

## 2021-11-15 DIAGNOSIS — Z4789 Encounter for other orthopedic aftercare: Secondary | ICD-10-CM | POA: Insufficient documentation

## 2021-11-16 DIAGNOSIS — Z4789 Encounter for other orthopedic aftercare: Secondary | ICD-10-CM | POA: Diagnosis not present

## 2021-11-27 ENCOUNTER — Other Ambulatory Visit: Payer: Self-pay | Admitting: Cardiovascular Disease

## 2021-12-07 DIAGNOSIS — M25512 Pain in left shoulder: Secondary | ICD-10-CM | POA: Diagnosis not present

## 2021-12-11 DIAGNOSIS — M25512 Pain in left shoulder: Secondary | ICD-10-CM | POA: Diagnosis not present

## 2021-12-14 DIAGNOSIS — M25512 Pain in left shoulder: Secondary | ICD-10-CM | POA: Diagnosis not present

## 2021-12-18 DIAGNOSIS — Z4789 Encounter for other orthopedic aftercare: Secondary | ICD-10-CM | POA: Diagnosis not present

## 2021-12-19 DIAGNOSIS — M25512 Pain in left shoulder: Secondary | ICD-10-CM | POA: Diagnosis not present

## 2021-12-21 DIAGNOSIS — M25512 Pain in left shoulder: Secondary | ICD-10-CM | POA: Diagnosis not present

## 2021-12-25 DIAGNOSIS — M25512 Pain in left shoulder: Secondary | ICD-10-CM | POA: Diagnosis not present

## 2021-12-27 DIAGNOSIS — M25512 Pain in left shoulder: Secondary | ICD-10-CM | POA: Diagnosis not present

## 2021-12-31 DIAGNOSIS — M25512 Pain in left shoulder: Secondary | ICD-10-CM | POA: Diagnosis not present

## 2022-01-03 DIAGNOSIS — M25512 Pain in left shoulder: Secondary | ICD-10-CM | POA: Diagnosis not present

## 2022-01-08 DIAGNOSIS — M25512 Pain in left shoulder: Secondary | ICD-10-CM | POA: Diagnosis not present

## 2022-01-10 DIAGNOSIS — M25512 Pain in left shoulder: Secondary | ICD-10-CM | POA: Diagnosis not present

## 2022-01-14 DIAGNOSIS — M25512 Pain in left shoulder: Secondary | ICD-10-CM | POA: Diagnosis not present

## 2022-01-17 DIAGNOSIS — M25512 Pain in left shoulder: Secondary | ICD-10-CM | POA: Diagnosis not present

## 2022-01-22 DIAGNOSIS — M25512 Pain in left shoulder: Secondary | ICD-10-CM | POA: Diagnosis not present

## 2022-01-28 DIAGNOSIS — M25512 Pain in left shoulder: Secondary | ICD-10-CM | POA: Diagnosis not present

## 2022-01-29 DIAGNOSIS — S42022D Displaced fracture of shaft of left clavicle, subsequent encounter for fracture with routine healing: Secondary | ICD-10-CM | POA: Diagnosis not present

## 2022-03-12 DIAGNOSIS — S42002D Fracture of unspecified part of left clavicle, subsequent encounter for fracture with routine healing: Secondary | ICD-10-CM | POA: Diagnosis not present

## 2022-04-16 ENCOUNTER — Ambulatory Visit (HOSPITAL_COMMUNITY): Payer: Medicare Other | Attending: Internal Medicine

## 2022-04-16 ENCOUNTER — Encounter: Payer: Self-pay | Admitting: Cardiovascular Disease

## 2022-04-16 DIAGNOSIS — I350 Nonrheumatic aortic (valve) stenosis: Secondary | ICD-10-CM | POA: Diagnosis not present

## 2022-04-16 LAB — ECHOCARDIOGRAM COMPLETE
AR max vel: 0.99 cm2
AV Area VTI: 1.07 cm2
AV Area mean vel: 0.97 cm2
AV Mean grad: 22 mmHg
AV Peak grad: 36 mmHg
Ao pk vel: 3 m/s
Area-P 1/2: 2.1 cm2
P 1/2 time: 499 msec
S' Lateral: 3.1 cm

## 2022-04-23 DIAGNOSIS — H02831 Dermatochalasis of right upper eyelid: Secondary | ICD-10-CM | POA: Diagnosis not present

## 2022-04-23 DIAGNOSIS — H0288A Meibomian gland dysfunction right eye, upper and lower eyelids: Secondary | ICD-10-CM | POA: Diagnosis not present

## 2022-04-23 DIAGNOSIS — Z961 Presence of intraocular lens: Secondary | ICD-10-CM | POA: Diagnosis not present

## 2022-04-23 DIAGNOSIS — H353122 Nonexudative age-related macular degeneration, left eye, intermediate dry stage: Secondary | ICD-10-CM | POA: Diagnosis not present

## 2022-05-02 DIAGNOSIS — L309 Dermatitis, unspecified: Secondary | ICD-10-CM | POA: Diagnosis not present

## 2022-06-20 ENCOUNTER — Encounter: Payer: Self-pay | Admitting: Cardiovascular Disease

## 2022-07-02 DIAGNOSIS — N182 Chronic kidney disease, stage 2 (mild): Secondary | ICD-10-CM | POA: Diagnosis not present

## 2022-07-02 DIAGNOSIS — R7989 Other specified abnormal findings of blood chemistry: Secondary | ICD-10-CM | POA: Diagnosis not present

## 2022-07-02 DIAGNOSIS — I10 Essential (primary) hypertension: Secondary | ICD-10-CM | POA: Diagnosis not present

## 2022-07-02 DIAGNOSIS — Z125 Encounter for screening for malignant neoplasm of prostate: Secondary | ICD-10-CM | POA: Diagnosis not present

## 2022-07-03 ENCOUNTER — Ambulatory Visit: Payer: Medicare Other | Admitting: Cardiovascular Disease

## 2022-07-03 ENCOUNTER — Encounter: Payer: Self-pay | Admitting: Cardiovascular Disease

## 2022-07-03 VITALS — BP 136/70 | HR 53 | Ht 73.0 in | Wt 190.2 lb

## 2022-07-03 DIAGNOSIS — I2510 Atherosclerotic heart disease of native coronary artery without angina pectoris: Secondary | ICD-10-CM

## 2022-07-03 DIAGNOSIS — I10 Essential (primary) hypertension: Secondary | ICD-10-CM

## 2022-07-03 DIAGNOSIS — R82998 Other abnormal findings in urine: Secondary | ICD-10-CM | POA: Diagnosis not present

## 2022-07-03 DIAGNOSIS — I350 Nonrheumatic aortic (valve) stenosis: Secondary | ICD-10-CM | POA: Diagnosis not present

## 2022-07-03 NOTE — [facility] Notes (Signed)
Chief Complaint  Patient presents with   Follow-up    Aortic valve disease   History of Present Illness: 76 yo male with history of HTN, prior tobacco abuse, anemia, COPD, CKD stage 2, BPH, aortic valve stenosis, mitral valve disease who is here today for follow up. I saw him as a new consult in December 2021 for the evaluation of abnormal echo with aortic stenosis and insufficiency. Echo ordered in primary care 10/03/20 showed LVEF=50-55%, moderate LVH. Mild MR. Mild aortic stenosis with mean gradient 18 mmHg, AVA 1.9 cm2. There was moderate aortic regurgitation. He had no symptoms so we elected to follow his valvular disease. Echo May 2022 with LVEF=60-65%. Mild AI. Mild aortic stenosis with mean gradient 18 mmHg, AVA 1.6 cm2. Cardiac CT calcium score 2437. 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 I reviewed this with our CT surgery team and recommendations for follow up CTA in 6 months. Follow up chest CTA in August 2022 with 3.5 cm ascending aorta with small defect along the inferior wall of the proximal arch-stable. Echo May 2023 with LVEF=60-65%. Moderate LVH. Mild MR. Mild AI. Moderate aortic valve stenosis with AVA 1.07 cm2, mean gradient 22 mmHg, DI 0.34.   He smoked 1ppd for 50 years and quit in 2013. He is retired English as a second language teacher. His wife is a retired Advertising account planner at [employer].   He is here today for follow up. The patient denies any chest pain, dyspnea, palpitations, lower extremity edema, orthopnea, PND, dizziness, near syncope or syncope.   Primary Care Physician: Haywood Pao, MD   Past Medical History:  Diagnosis Date   Allergy    SEASONAL   Anemia    Aortic insufficiency    Aortic stenosis    BPH (benign prostatic hyperplasia)    CKD (chronic kidney disease) stage 2, GFR 99-83 ml/min    Complication of anesthesia    Innovar   COPD (chronic  obstructive pulmonary disease) (HCC)    Heart murmur    HTN (hypertension)    Mitral regurgitation    Serrated adenoma of colon 01/2005    Past Surgical History:  Procedure Laterality Date   APPENDECTOMY     COLONOSCOPY W/ POLYPECTOMY     GUM SURGERY  12/02/2006   tumor right upper gum    ORIF CLAVICULAR FRACTURE Left 11/02/2021   Procedure: OPEN REDUCTION INTERNAL FIXATION (ORIF) CLAVICULAR FRACTURE;  Surgeon: Nicholes Stairs, MD;  Location: [place];  Service: Orthopedics;  Laterality: Left;    Current Outpatient Medications  Medication Sig Dispense Refill   acetaminophen (TYLENOL) 500 MG tablet Take 1,000 mg by mouth every 8 (eight) hours as needed for moderate pain.     aspirin EC 81 MG tablet Take 1 tablet (81 mg total) by mouth daily. Swallow whole. 90 tablet 3   losartan (COZAAR) 50 MG tablet Take 1 tablet (50 mg total) by mouth daily. 90 tablet 2   ondansetron (ZOFRAN) 4 MG tablet Take 1 tablet (4 mg total) by mouth daily as needed for nausea or vomiting. 30 tablet 1   oxyCODONE (ROXICODONE) 5 MG immediate release tablet Take 1 tablet (5 mg total) by mouth every 4 (four) hours as needed for severe pain. 22 tablet 0   rosuvastatin (CRESTOR) 5 MG tablet TAKE 1 TABLET (5 MG TOTAL) BY MOUTH DAILY. 90 tablet 3   Current Facility-Administered Medications  Medication Dose  Route Frequency Provider Last Rate Last Admin   0.9 %  sodium chloride infusion  500 mL Intravenous Once Ladene Artist, MD        Allergies  Allergen Reactions   Midazolam Anaphylaxis    History of reaction to a similar family of this anesthesia   Other Anaphylaxis    Innovar=respiratory arrest   Droperidol Other (See Comments)    Unsure of allergy     Social History   Socioeconomic History   Marital status: Married    Spouse name: Not on file   Number of children: 2   Years of education: Not on file   Highest education level: Not on file  Occupational History   Occupation: Retired Engineering geologist  Tobacco Use   Smoking status: Former    Packs/day: 1.00    Years: 50.00    Total pack years: 50.00    Types: Cigarettes    Quit date: 12/03/2011    Years since quitting: 10.5   Smokeless tobacco: Never  Vaping Use   Vaping Use: Never used  Substance and Sexual Activity   Alcohol use: Yes    Alcohol/week: 2.0 standard drinks of alcohol    Types: 2 Glasses of wine per week   Drug use: No   Sexual activity: Not on file  Other Topics Concern   Not on file  Social History Narrative   Not on file   Social Determinants of Health   Financial Resource Strain: Not on file  Food Insecurity: Not on file  Transportation Needs: Not on file  Physical Activity: Not on file  Stress: Not on file  Social Connections: Not on file  Intimate Partner Violence: Not on file    Family History  Problem Relation Age of Onset   Colon cancer Maternal Aunt        age 32-60   CAD Father    Diabetes Brother     Review of Systems:  As stated in the HPI and otherwise negative.   BP 136/70   Pulse (!) 53   Ht '6\' 1"'$  (1.854 m)   Wt 190 lb 3.2 oz (86.3 kg)   SpO2 99%   BMI 25.09 kg/m   Physical Examination: General: Well developed, well nourished, NAD  HEENT: OP clear, mucus membranes moist  SKIN: warm, dry. No rashes. Neuro: No focal deficits  Musculoskeletal: Muscle strength 5/5 all ext  Psychiatric: Mood and affect normal  Neck: No JVD, no carotid bruits, no thyromegaly, no lymphadenopathy.  Lungs:Clear bilaterally, no wheezes, rhonci, crackles Cardiovascular: Regular rate and rhythm. Systolic murmur.  Abdomen:Soft. Bowel sounds present. Non-tender.  Extremities: No lower extremity edema. Pulses are 2 + in the bilateral DP/PT.  EKG:  EKG is ordered today. The ekg ordered today demonstrates Sinus brady, rate 53 bpm. Poor R wave progression. Subtle ST elevation V2, V3-unchanged from 2021  Echo 04/16/22:  1. Left ventricular ejection fraction, by estimation, is 60 to 65%. The   left ventricle has normal function. The left ventricle has no regional  wall motion abnormalities. There is moderate left ventricular hypertrophy.  Left ventricular diastolic  parameters are consistent with Grade I diastolic dysfunction (impaired  relaxation). The average left ventricular global longitudinal strain is  -17.6 %. The global longitudinal strain is abnormal.   2. Right ventricular systolic function is normal. The right ventricular  size is normal. There is normal pulmonary artery systolic pressure. The  estimated right ventricular systolic pressure is 10.6 mmHg.   3.  The mitral valve is abnormal. Mild mitral valve regurgitation.   4. The aortic valve is tricuspid. There is moderate calcification of the  aortic valve. Aortic valve regurgitation is mild. Moderate aortic valve  stenosis. Aortic regurgitation PHT measures 499 msec. Aortic valve area,  by VTI measures 1.07 cm. Aortic  valve mean gradient measures 22.0 mmHg. Aortic valve Vmax measures 3.00  m/s. Peak gradient 36 mmHg, DI 0.34.   5. The inferior vena cava is normal in size with greater than 50%  respiratory variability, suggesting right atrial pressure of 3 mmHg.   Recent Labs: 11/02/2021: BUN 33; Creatinine, Ser 1.34; Hemoglobin 13.4; Platelets 238; Potassium 5.2; Sodium 136   Lipid Panel    Component Value Date/Time   CHOL 108 03/14/2021 0732   TRIG 87 03/14/2021 0732   HDL 44 03/14/2021 0732   CHOLHDL 2.5 03/14/2021 0732   LDLCALC 47 03/14/2021 0732     Wt Readings from Last 3 Encounters:  07/03/22 190 lb 3.2 oz (86.3 kg)  11/02/21 195 lb (88.5 kg)  10/20/21 195 lb (88.5 kg)     Assessment and Plan:   1. Aortic insufficiency/aortic stenosis: He has mild AI and moderate AS by echo in May 2023. Repeat echo in May 2024.   2. CAD without angina: Abnormal calcium score with CAD noted on chest CTA. He has no chest pain suggestive of angina. Will continue ASA and statin. LDL 31 on 07/02/22.   3. HTN: BP  is controlled. No changes  4. Ascending aorta ulceration: Stable by chest CTA in August 2022. Will not repeat now.    Current medicines are reviewed at length with the patient today.  The patient does not have concerns regarding medicines.  The following changes have been made:  no change  Labs/ tests ordered today include:   Orders Placed This Encounter  Procedures   EKG 12-Lead   ECHOCARDIOGRAM COMPLETE     Disposition:   F/U with me in 12 months.    Signed, Lauree Chandler, MD 07/03/2022 12:07 PM    [facility] [place], [place], [place]  [address] Phone: [phone]; Fax: [phone]

## 2022-07-03 NOTE — Patient Instructions (Signed)
Medication Instructions:  No changes *If you need a refill on your cardiac medications before your next appointment, please call your pharmacy*   Lab Work: none If you have labs (blood work) drawn today and your tests are completely normal, you will receive your results only by: [facility] (if you have MyChart) OR A paper copy in the mail If you have any lab test that is abnormal or we need to change your treatment, we will call you to review the results.   Testing/Procedures: Your physician has requested that you have an echocardiogram. Echocardiography is a painless test that uses sound waves to create images of your heart. It provides your doctor with information about the size and shape of your heart and how well your heart's chambers and valves are working. This procedure takes approximately one hour. There are no restrictions for this procedure.   Follow-Up: At [facility], you and your health needs are our priority.  As part of our continuing mission to provide you with exceptional heart care, we have created designated Provider Care Teams.  These Care Teams include your primary Cardiologist (physician) and Advanced Practice Providers (APPs -  Physician Assistants and Nurse Practitioners) who all work together to provide you with the care you need, when you need it.   Your next appointment:   12 month(s)  The format for your next appointment:   In Person  Provider:   Lauree Chandler, MD     Important Information About Sugar

## 2022-07-09 DIAGNOSIS — N182 Chronic kidney disease, stage 2 (mild): Secondary | ICD-10-CM | POA: Diagnosis not present

## 2022-07-09 DIAGNOSIS — I1310 Hypertensive heart and chronic kidney disease without heart failure, with stage 1 through stage 4 chronic kidney disease, or unspecified chronic kidney disease: Secondary | ICD-10-CM | POA: Diagnosis not present

## 2022-07-09 DIAGNOSIS — I351 Nonrheumatic aortic (valve) insufficiency: Secondary | ICD-10-CM | POA: Diagnosis not present

## 2022-07-09 DIAGNOSIS — Z0000 Encounter for general adult medical examination without abnormal findings: Secondary | ICD-10-CM | POA: Diagnosis not present

## 2022-07-28 ENCOUNTER — Other Ambulatory Visit: Payer: Self-pay | Admitting: Cardiovascular Disease

## 2022-07-30 ENCOUNTER — Telehealth: Payer: Self-pay

## 2022-07-30 MED ORDER — LOSARTAN POTASSIUM 50 MG PO TABS: 50.0000 mg | ORAL_TABLET | Freq: Every day | ORAL | 2 refills | Status: DC

## 2022-07-30 NOTE — Telephone Encounter (Signed)
Received a call from the patient requesting a refill on his Losartan rx. He states he is going out of town later this week and only has a couple of tablets left and would like a refill before he going out of town.    Chart reviewed.   Rx(s) sent to pharmacy electronically.  Patient voiced understanding.

## 2022-08-01 DIAGNOSIS — N401 Enlarged prostate with lower urinary tract symptoms: Secondary | ICD-10-CM | POA: Diagnosis not present

## 2022-08-01 DIAGNOSIS — N138 Other obstructive and reflux uropathy: Secondary | ICD-10-CM | POA: Diagnosis not present

## 2022-08-10 DIAGNOSIS — M25571 Pain in right ankle and joints of right foot: Secondary | ICD-10-CM | POA: Diagnosis not present

## 2022-08-21 DIAGNOSIS — H903 Sensorineural hearing loss, bilateral: Secondary | ICD-10-CM | POA: Diagnosis not present

## 2022-08-23 DIAGNOSIS — M898X1 Other specified disorders of bone, shoulder: Secondary | ICD-10-CM | POA: Insufficient documentation

## 2022-08-26 DIAGNOSIS — L821 Other seborrheic keratosis: Secondary | ICD-10-CM | POA: Diagnosis not present

## 2022-08-26 DIAGNOSIS — L814 Other melanin hyperpigmentation: Secondary | ICD-10-CM | POA: Diagnosis not present

## 2022-08-26 DIAGNOSIS — D692 Other nonthrombocytopenic purpura: Secondary | ICD-10-CM | POA: Diagnosis not present

## 2022-08-26 DIAGNOSIS — Z85828 Personal history of other malignant neoplasm of skin: Secondary | ICD-10-CM | POA: Diagnosis not present

## 2022-08-27 DIAGNOSIS — M25571 Pain in right ankle and joints of right foot: Secondary | ICD-10-CM | POA: Diagnosis not present

## 2022-09-03 DIAGNOSIS — M25512 Pain in left shoulder: Secondary | ICD-10-CM | POA: Diagnosis not present

## 2022-09-04 ENCOUNTER — Encounter: Payer: Self-pay | Admitting: Cardiovascular Disease

## 2022-09-06 ENCOUNTER — Ambulatory Visit: Payer: Medicare Other | Admitting: Cardiovascular Disease

## 2022-09-17 DIAGNOSIS — M25571 Pain in right ankle and joints of right foot: Secondary | ICD-10-CM | POA: Diagnosis not present

## 2022-11-15 ENCOUNTER — Ambulatory Visit (HOSPITAL_COMMUNITY)
Admission: RE | Admit: 2022-11-15 | Discharge: 2022-11-15 | Disposition: A | Discharge status: Home / Self Care | Payer: Medicare Other | Source: Ambulatory Visit | Attending: Cardiovascular Disease | Admitting: Cardiovascular Disease

## 2022-11-15 ENCOUNTER — Other Ambulatory Visit (HOSPITAL_COMMUNITY): Payer: Self-pay | Admitting: Family Medicine

## 2022-11-15 DIAGNOSIS — M79661 Pain in right lower leg: Secondary | ICD-10-CM | POA: Insufficient documentation

## 2022-11-15 DIAGNOSIS — M79604 Pain in right leg: Secondary | ICD-10-CM | POA: Diagnosis not present

## 2022-11-15 DIAGNOSIS — M79602 Pain in left arm: Secondary | ICD-10-CM | POA: Diagnosis not present

## 2022-11-15 DIAGNOSIS — M79601 Pain in right arm: Secondary | ICD-10-CM | POA: Diagnosis not present

## 2022-11-19 DIAGNOSIS — H903 Sensorineural hearing loss, bilateral: Secondary | ICD-10-CM | POA: Diagnosis not present

## 2022-12-26 DIAGNOSIS — K080 Exfoliation of teeth due to systemic causes: Secondary | ICD-10-CM | POA: Diagnosis not present

## 2023-01-13 ENCOUNTER — Other Ambulatory Visit: Payer: Self-pay | Admitting: Cardiovascular Disease

## 2023-01-31 DIAGNOSIS — J309 Allergic rhinitis, unspecified: Secondary | ICD-10-CM | POA: Diagnosis not present

## 2023-01-31 DIAGNOSIS — J069 Acute upper respiratory infection, unspecified: Secondary | ICD-10-CM | POA: Diagnosis not present

## 2023-01-31 DIAGNOSIS — I1310 Hypertensive heart and chronic kidney disease without heart failure, with stage 1 through stage 4 chronic kidney disease, or unspecified chronic kidney disease: Secondary | ICD-10-CM | POA: Diagnosis not present

## 2023-01-31 DIAGNOSIS — R0981 Nasal congestion: Secondary | ICD-10-CM | POA: Diagnosis not present

## 2023-02-13 DIAGNOSIS — N138 Other obstructive and reflux uropathy: Secondary | ICD-10-CM | POA: Diagnosis not present

## 2023-02-13 DIAGNOSIS — N401 Enlarged prostate with lower urinary tract symptoms: Secondary | ICD-10-CM | POA: Diagnosis not present

## 2023-02-18 DIAGNOSIS — K080 Exfoliation of teeth due to systemic causes: Secondary | ICD-10-CM | POA: Diagnosis not present

## 2023-02-26 DIAGNOSIS — M25512 Pain in left shoulder: Secondary | ICD-10-CM | POA: Diagnosis not present

## 2023-03-10 DIAGNOSIS — K080 Exfoliation of teeth due to systemic causes: Secondary | ICD-10-CM | POA: Diagnosis not present

## 2023-04-02 ENCOUNTER — Ambulatory Visit (HOSPITAL_COMMUNITY): Payer: Medicare Other | Attending: Cardiovascular Disease

## 2023-04-02 DIAGNOSIS — I350 Nonrheumatic aortic (valve) stenosis: Secondary | ICD-10-CM | POA: Insufficient documentation

## 2023-04-02 LAB — ECHOCARDIOGRAM COMPLETE
AR max vel: 1.2 cm2
AV Area VTI: 1.24 cm2
AV Area mean vel: 1.2 cm2
AV Mean grad: 23.6 mmHg
AV Peak grad: 39.7 mmHg
Ao pk vel: 3.15 m/s
Area-P 1/2: 2.35 cm2
P 1/2 time: 432 msec
S' Lateral: 2.9 cm

## 2023-04-17 ENCOUNTER — Encounter: Payer: Self-pay | Admitting: Gastroenterology

## 2023-04-23 ENCOUNTER — Other Ambulatory Visit: Payer: Self-pay | Admitting: Cardiovascular Disease

## 2023-04-25 ENCOUNTER — Encounter: Payer: Self-pay | Admitting: Gastroenterology

## 2023-05-13 DIAGNOSIS — K080 Exfoliation of teeth due to systemic causes: Secondary | ICD-10-CM | POA: Diagnosis not present

## 2023-05-21 ENCOUNTER — Ambulatory Visit (AMBULATORY_SURGERY_CENTER): Payer: Medicare Other

## 2023-05-21 ENCOUNTER — Encounter: Payer: Self-pay | Admitting: Gastroenterology

## 2023-05-21 VITALS — Ht 73.0 in | Wt 195.0 lb

## 2023-05-21 DIAGNOSIS — Z86010 Personal history of colonic polyps: Secondary | ICD-10-CM

## 2023-05-21 MED ORDER — NA SULFATE-K SULFATE-MG SULF 17.5-3.13-1.6 GM/177ML PO SOLN
1.0000 | Freq: Once | ORAL | 0 refills | Status: AC
Start: 2023-05-21 — End: 2023-05-21

## 2023-05-21 NOTE — [facility] Notes (Signed)
No egg or soy allergy known to patient  No issues known to pt with past sedation with any surgeries or procedures Patient denies ever being told they had issues or difficulty with intubation  No FH of Malignant Hyperthermia Pt is not on diet pills Pt is not on  home 02  Pt is not on blood thinners  Pt denies issues with constipation - 2 day prep given adequate prep results  No A fib or A flutter Have any cardiac testing pending-- no  LOA: independent   Patient's chart reviewed by Cathlyn Parsons CNRA prior to previsit and patient appropriate for the [facility].  Previsit completed and red dot placed by patient's name on their procedure day (on provider's schedule).     PV complete. Prep instructions sent to patient via mychart and home address. Good rx coupon for [facility] provided for a price reduction on prep if needed. Pt to take coupon to pharm at time of purchase.

## 2023-05-28 DIAGNOSIS — H11153 Pinguecula, bilateral: Secondary | ICD-10-CM | POA: Diagnosis not present

## 2023-05-28 DIAGNOSIS — H43812 Vitreous degeneration, left eye: Secondary | ICD-10-CM | POA: Diagnosis not present

## 2023-05-28 DIAGNOSIS — H353122 Nonexudative age-related macular degeneration, left eye, intermediate dry stage: Secondary | ICD-10-CM | POA: Diagnosis not present

## 2023-05-28 DIAGNOSIS — Z961 Presence of intraocular lens: Secondary | ICD-10-CM | POA: Diagnosis not present

## 2023-05-28 DIAGNOSIS — H524 Presbyopia: Secondary | ICD-10-CM | POA: Diagnosis not present

## 2023-05-28 DIAGNOSIS — H52223 Regular astigmatism, bilateral: Secondary | ICD-10-CM | POA: Diagnosis not present

## 2023-06-07 DIAGNOSIS — H524 Presbyopia: Secondary | ICD-10-CM | POA: Diagnosis not present

## 2023-06-17 ENCOUNTER — Ambulatory Visit (AMBULATORY_SURGERY_CENTER): Payer: Medicare Other | Admitting: Gastroenterology

## 2023-06-17 ENCOUNTER — Encounter: Payer: Self-pay | Admitting: Gastroenterology

## 2023-06-17 VITALS — BP 126/49 | HR 66 | Temp 98.6°F | Resp 11 | Ht 73.0 in | Wt 195.0 lb

## 2023-06-17 DIAGNOSIS — Z09 Encounter for follow-up examination after completed treatment for conditions other than malignant neoplasm: Secondary | ICD-10-CM | POA: Diagnosis not present

## 2023-06-17 DIAGNOSIS — D122 Benign neoplasm of ascending colon: Secondary | ICD-10-CM

## 2023-06-17 DIAGNOSIS — Z86010 Personal history of colonic polyps: Secondary | ICD-10-CM | POA: Diagnosis not present

## 2023-06-17 MED ORDER — SODIUM CHLORIDE 0.9 % IV SOLN: 500.0000 mL | Freq: Once | INTRAVENOUS | Status: DC

## 2023-06-17 NOTE — [facility] Notes (Signed)
 Pt's states no medical or surgical changes since previsit or office visit. 

## 2023-06-17 NOTE — Patient Instructions (Signed)
YOU HAD AN ENDOSCOPIC PROCEDURE TODAY AT THE [facility]:   Refer to the procedure report that was given to you for any specific questions about what was found during the examination.  If the procedure report does not answer your questions, please call your gastroenterologist to clarify.  If you requested that your care partner not be given the details of your procedure findings, then the procedure report has been included in a sealed envelope for you to review at your convenience later.  **Handouts given on polyps and hemorrhoids**  YOU SHOULD EXPECT: Some feelings of bloating in the abdomen. Passage of more gas than usual.  Walking can help get rid of the air that was put into your GI tract during the procedure and reduce the bloating. If you had a lower endoscopy (such as a colonoscopy or flexible sigmoidoscopy) you may notice spotting of blood in your stool or on the toilet paper. If you underwent a bowel prep for your procedure, you may not have a normal bowel movement for a few days.  Please Note:  You might notice some irritation and congestion in your nose or some drainage.  This is from the oxygen used during your procedure.  There is no need for concern and it should clear up in a day or so.  SYMPTOMS TO REPORT IMMEDIATELY:  Following lower endoscopy (colonoscopy or flexible sigmoidoscopy):  Excessive amounts of blood in the stool  Significant tenderness or worsening of abdominal pains  Swelling of the abdomen that is new, acute  Fever of 100F or higher  For urgent or emergent issues, a gastroenterologist can be reached at any hour by calling [phone]. Do not use MyChart messaging for urgent concerns.    DIET:  We do recommend a small meal at first, but then you may proceed to your regular diet.  Drink plenty of fluids but you should avoid alcoholic beverages for 24 hours.  ACTIVITY:  You should plan to take it easy for the rest of today and you should NOT DRIVE or  use heavy machinery until tomorrow (because of the sedation medicines used during the test).    FOLLOW UP: Our staff will call the number listed on your records the next business day following your procedure.  We will call around 7:15- 8:00 am to check on you and address any questions or concerns that you may have regarding the information given to you following your procedure. If we do not reach you, we will leave a message.     If any biopsies were taken you will be contacted by phone or by letter within the next 1-3 weeks.  Please call us at [phone] if you have not heard about the biopsies in 3 weeks.    SIGNATURES/CONFIDENTIALITY: You and/or your care partner have signed paperwork which will be entered into your electronic medical record.  These signatures attest to the fact that that the information above on your After Visit Summary has been reviewed and is understood.  Full responsibility of the confidentiality of this discharge information lies with you and/or your care-partner.  

## 2023-06-17 NOTE — Op Note (Signed)
[facility] Patient Name: Stephen Lang Procedure Date: 06/17/2023 8:51 AM MRN: [id number] Endoscopist: Meryl Dare , MD, [phone] Age: 77 Referring MD:  Date of Birth: 11-Dec-1945 Gender: Male Account #: [id number] Procedure:                Colonoscopy Indications:              High risk colon cancer surveillance: Personal                            history of sessile serrated colon polyp (less than                            10 mm in size) with no dysplasia Medicines:                Monitored Anesthesia Care Procedure:                Pre-Anesthesia Assessment:                           - Prior to the procedure, a History and Physical                            was performed, and patient medications and                            allergies were reviewed. The patient's tolerance of                            previous anesthesia was also reviewed. The risks                            and benefits of the procedure and the sedation                            options and risks were discussed with the patient.                            All questions were answered, and informed consent                            was obtained. Prior Anticoagulants: The patient has                            taken no anticoagulant or antiplatelet agents. ASA                            Grade Assessment: III - A patient with severe                            systemic disease. After reviewing the risks and                            benefits, the patient was deemed in satisfactory  condition to undergo the procedure.                           After obtaining informed consent, the colonoscope                            was passed under direct vision. Throughout the                            procedure, the patient's blood pressure, pulse, and                            oxygen saturations were monitored continuously. The                            CF [id number] #[id number] was  introduced through the anus                            and advanced to the the cecum, identified by                            appendiceal orifice and ileocecal valve. The                            ileocecal valve, appendiceal orifice, and rectum                            were photographed. The quality of the bowel                            preparation was good. The colonoscopy was somewhat                            difficult due to a redundant colon and a tortuous                            colon. The patient tolerated the procedure well. Scope In: 8:57:01 AM Scope Out: 9:15:13 AM Scope Withdrawal Time: 0 hours 13 minutes 52 seconds  Total Procedure Duration: 0 hours 18 minutes 12 seconds  Findings:                 The perianal and digital rectal examinations were                            normal.                           An 8 mm polyp was found in the ascending colon. The                            polyp was sessile. The polyp was removed with a                            cold snare. Resection and retrieval were complete.  Three small (2) and medium-sized (1) localized                            angioectasias without bleeding were found in the                            ascending colon and in the cecum.                           Internal hemorrhoids were found during                            retroflexion. The hemorrhoids were small and Grade                            I (internal hemorrhoids that do not prolapse).                           The exam was otherwise without abnormality on                            direct and retroflexion views. Complications:            No immediate complications. Estimated blood loss:                            None. Estimated Blood Loss:     Estimated blood loss: none. Impression:               - One 8 mm polyp in the ascending colon, removed                            with a cold snare. Resected and retrieved.                            - Three non-bleeding colonic angioectasias.                           - Internal hemorrhoids.                           - The examination was otherwise normal on direct                            and retroflexion views. Recommendation:           - Repeat colonoscopy vs no repeat due to age after                            studies are complete for surveillance based on                            pathology results.                           - Patient has a contact number available for  emergencies. The signs and symptoms of potential                            delayed complications were discussed with the                            patient. Return to normal activities tomorrow.                            Written discharge instructions were provided to the                            patient.                           - Resume previous diet.                           - Continue present medications.                           - Await pathology results. Meryl Dare, MD 06/17/2023 9:20:11 AM This report has been signed electronically.

## 2023-06-17 NOTE — [facility] Notes (Signed)
Vss nad trans to pacu 

## 2023-06-17 NOTE — [facility] Notes (Signed)
 Called to room to assist during endoscopic procedure.  Patient ID and intended procedure confirmed with present staff. Received instructions for my participation in the procedure from the performing physician.  

## 2023-06-17 NOTE — Progress Notes (Signed)
History & Physical  Primary Care Physician:  Tisovec, Adelfa Koh, MD Primary Gastroenterologist: Claudette Head, MD  Impression / Plan:  Personal history of sessile serrated colon polyp for surveillance colonoscopy.  CHIEF COMPLAINT: Personal history of colon polyps   HPI: Stephen Lang is a 77 y.o. male personal history of a sessile serrated colon polyp for surveillance colonoscopy.    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 Authorizing Provider  aspirin EC 81 MG tablet Take 1 tablet (81 mg total) by mouth daily. Swallow whole. 12/20/20  Yes Kathleene Hazel, MD  losartan (COZAAR) 50 MG tablet TAKE 1 TABLET BY MOUTH EVERY DAY 04/23/23  Yes Kathleene Hazel, MD  rosuvastatin (CRESTOR) 5 MG tablet TAKE 1 TABLET (5 MG TOTAL) BY MOUTH DAILY. 01/13/23  Yes Kathleene Hazel, MD  acetaminophen (TYLENOL) 500 MG tablet Take 1,000 mg by mouth every 8 (eight) hours as needed for moderate pain. Patient not taking: Reported on 05/21/2023    [provider]  olopatadine (PATANOL) 0.1 % ophthalmic solution Place 1 drop into both eyes 2 (two)  times daily. Patient not taking: Reported on 06/17/2023 03/22/16   [provider]  oxyCODONE (ROXICODONE) 5 MG immediate release tablet Take 1 tablet (5 mg total) by mouth every 4 (four) hours as needed for severe pain. Patient not taking: Reported on 05/21/2023 11/02/21   Yolonda Kida, MD  sildenafil (VIAGRA) 100 MG tablet Take 100 mg by mouth as needed. Patient not taking: Reported on 06/17/2023    [provider]  triamcinolone cream (KENALOG) 0.1 % Apply 1 Application topically 2 (two) times daily as needed.    [provider]    Current Outpatient Medications  Medication Sig Dispense Refill   aspirin EC 81 MG tablet Take 1 tablet (81 mg total) by mouth daily. Swallow whole. 90 tablet 3   losartan (COZAAR) 50 MG tablet TAKE 1 TABLET BY MOUTH EVERY DAY 90 tablet 0   rosuvastatin (CRESTOR) 5 MG tablet TAKE 1 TABLET (5 MG TOTAL) BY MOUTH DAILY. 90 tablet 1   acetaminophen (TYLENOL) 500 MG tablet Take 1,000 mg by mouth every 8 (eight) hours as needed for moderate pain. (Patient not taking: Reported on 05/21/2023)     olopatadine (PATANOL) 0.1 % ophthalmic solution Place 1 drop into both eyes 2 (two) times daily. (Patient not taking: Reported on 06/17/2023)     oxyCODONE (ROXICODONE) 5 MG immediate release tablet Take 1 tablet (5 mg total) by mouth every 4 (four) hours  as needed for severe pain. (Patient not taking: Reported on 05/21/2023) 22 tablet 0   sildenafil (VIAGRA) 100 MG tablet Take 100 mg by mouth as needed. (Patient not taking: Reported on 06/17/2023)     triamcinolone cream (KENALOG) 0.1 % Apply 1 Application topically 2 (two) times daily as needed.     Current Facility-Administered Medications  Medication Dose Route Frequency Provider Last Rate Last Admin   0.9 %  sodium chloride infusion  500 mL Intravenous Once Claudette Head T, MD       0.9 %  sodium chloride infusion  500 mL Intravenous Once Meryl Dare, MD        Allergies as of 06/17/2023 -  Review Complete 06/17/2023  Allergen Reaction Noted   Midazolam Anaphylaxis 12/01/2015   Other Anaphylaxis 12/01/2015   Droperidol Other (See Comments) 03/11/2016   Short ragweed pollen ext  09/04/2012    Family History  Problem Relation Age of Onset   CAD Father    Diabetes Brother    Colon cancer Maternal Aunt        age 48-60   Colon polyps Neg Hx    Rectal cancer Neg Hx    Stomach cancer Neg Hx    Esophageal cancer Neg Hx     Social History   Socioeconomic History   Marital status: Married    Spouse name: Not on file   Number of children: 2   Years of education: Not on file   Highest education level: Not on file  Occupational History   Occupation: Retired Astronomer  Tobacco Use   Smoking status: Former    Current packs/day: 0.00    Average packs/day: 1 pack/day for 50.0 years (50.0 ttl pk-yrs)    Types: Cigarettes    Start date: 12/02/1961    Quit date: 12/03/2011    Years since quitting: 11.5   Smokeless tobacco: Never  Vaping Use   Vaping status: Never Used  Substance and Sexual Activity   Alcohol use: Yes    Alcohol/week: 2.0 standard drinks of alcohol    Types: 2 Glasses of wine per week   Drug use: No   Sexual activity: Not on file  Other Topics Concern   Not on file  Social History Narrative   Not on file   Social Determinants of Health   Financial Resource Strain: Not on file  Food Insecurity: Not on file  Transportation Needs: Not on file  Physical Activity: Not on file  Stress: Not on file  Social Connections: Not on file  Intimate Partner Violence: Not on file    Review of Systems:  All systems reviewed were negative except where noted in HPI.   Physical Exam:  General:  Alert, well-developed, in NAD Head:  Normocephalic and atraumatic. Eyes:  Sclera clear, no icterus.   Conjunctiva pink. Ears:  Normal auditory acuity. Mouth:  No deformity or lesions.  Neck:  Supple; no masses. Lungs:  Clear throughout to auscultation.   No  wheezes, crackles, or rhonchi.  Heart:  Regular rate and rhythm; no murmurs. Abdomen:  Soft, nondistended, nontender. No masses, hepatomegaly. No palpable masses.  Normal bowel sounds.    Rectal:  Deferred   Msk:  Symmetrical without gross deformities. Extremities:  Without edema. Neurologic:  Alert and  oriented x 4; grossly normal neurologically. Skin:  Intact without significant lesions or rashes. Psych:  Alert and cooperative. Normal mood and affect.   Venita Lick. Russella Dar  06/17/2023, 8:51 AM See [facility]  GI, to contact our on call provider

## 2023-06-18 ENCOUNTER — Telehealth: Payer: Self-pay | Admitting: *Deleted

## 2023-06-18 NOTE — Telephone Encounter (Signed)
 Post procedure follow up call placed, no answer and left VM.  

## 2023-06-30 ENCOUNTER — Encounter: Payer: Self-pay | Admitting: Gastroenterology

## 2023-06-30 ENCOUNTER — Encounter: Payer: Self-pay | Admitting: Cardiovascular Disease

## 2023-06-30 ENCOUNTER — Ambulatory Visit: Payer: Medicare Other | Attending: Cardiovascular Disease | Admitting: Cardiovascular Disease

## 2023-06-30 VITALS — BP 122/60 | HR 79 | Ht 73.0 in | Wt 184.6 lb

## 2023-06-30 DIAGNOSIS — I10 Essential (primary) hypertension: Secondary | ICD-10-CM | POA: Diagnosis not present

## 2023-06-30 DIAGNOSIS — I350 Nonrheumatic aortic (valve) stenosis: Secondary | ICD-10-CM

## 2023-06-30 DIAGNOSIS — I2510 Atherosclerotic heart disease of native coronary artery without angina pectoris: Secondary | ICD-10-CM

## 2023-06-30 NOTE — Patient Instructions (Signed)
Medication Instructions:  No changes *If you need a refill on your cardiac medications before your next appointment, please call your pharmacy*   Lab Work: none   Testing/Procedures: Your physician has requested that you have an echocardiogram. Echocardiography is a painless test that uses sound waves to create images of your heart. It provides your doctor with information about the size and shape of your heart and how well your heart's chambers and valves are working. This procedure takes approximately one hour. There are no restrictions for this procedure. Please do NOT wear cologne, perfume, aftershave, or lotions (deodorant is allowed). Please arrive 15 minutes prior to your appointment time.    Follow-Up: At [facility], you and your health needs are our priority.  As part of our continuing mission to provide you with exceptional heart care, we have created designated Provider Care Teams.  These Care Teams include your primary Cardiologist (physician) and Advanced Practice Providers (APPs -  Physician Assistants and Nurse Practitioners) who all work together to provide you with the care you need, when you need it.    Your next appointment:   12 month(s)  Provider:   Lauree Chandler, MD

## 2023-06-30 NOTE — Progress Notes (Signed)
Chief Complaint  Patient presents with   Follow-up    Aortic valve disease   History of Present Illness: 77 yo male with history of HTN, prior tobacco abuse, anemia, COPD, CKD, BPH, aortic valve stenosis and mitral valve disease who is here today for follow up. I saw him as a new consult in December 2021 for the evaluation of abnormal echo with aortic stenosis and insufficiency. Echo ordered in primary care 10/03/20 showed LVEF=50-55%, moderate LVH. Mild MR. Mild aortic stenosis with mean gradient 18 mmHg, AVA 1.9 cm2. There was moderate aortic regurgitation. He had no symptoms so we elected to follow his valvular disease. Echo May 2022 with LVEF=60-65%. Mild AI. Mild aortic stenosis with mean gradient 18 mmHg, AVA 1.6 cm2. Cardiac CT calcium score 2437. 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 I reviewed this with our CT surgery team and recommendations for follow up CTA in 6 months. Follow up chest CTA in August 2022 with 3.5 cm ascending aorta with small defect along the inferior wall of the proximal arch-stable. Echo May 2023 with normal LV function, moderate AS and mild AI. Most recent echo May 2024 with LVEF=60-65%. Moderate aortic stenosis with mean gradient 26 mmHg, AVA 1.2 cm2, DI 0.30, moderate AI.    He is here today for follow up. The patient denies any chest pain, dyspnea, palpitations, lower extremity edema, orthopnea, PND, dizziness, near syncope or syncope.   He smoked 1ppd for 50 years and quit in 2013. He is retired Charity fundraiser. His wife is a retired Nurse, mental health at [employer].   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     aspirin EC 81 MG tablet Take 1 tablet (81 mg total) by mouth daily. Swallow whole. 90 tablet 3   losartan (COZAAR) 50 MG tablet TAKE 1 TABLET BY MOUTH EVERY DAY 90 tablet 0   olopatadine (PATANOL) 0.1 % ophthalmic solution Place 1 drop into both eyes 2 (two) times daily.     oxyCODONE (ROXICODONE) 5 MG immediate release tablet Take 1 tablet (5 mg total) by mouth every 4 (four) hours as needed for severe pain. 22 tablet 0   rosuvastatin (CRESTOR) 5 MG tablet TAKE 1 TABLET (5 MG TOTAL) BY MOUTH DAILY. 90 tablet 1  sildenafil (VIAGRA) 100 MG tablet Take 100 mg by mouth as needed.     triamcinolone cream (KENALOG) 0.1 % Apply 1 Application topically 2 (two) times daily as needed.     No current facility-administered medications for this visit.    Allergies  Allergen Reactions   Midazolam Anaphylaxis    History of reaction to a similar family of this anesthesia   Other Anaphylaxis    Innovar=respiratory arrest   Droperidol Other (See Comments)    Unsure of allergy  Other Reaction(s): Other (See Comments)  other   Short Ragweed Pollen Ext     Other Reaction(s): Other (See Comments)    Social History   Socioeconomic History    Marital status: Married    Spouse name: Not on file   Number of children: 2   Years of education: Not on file   Highest education level: Not on file  Occupational History   Occupation: Retired Astronomer  Tobacco Use   Smoking status: Former    Current packs/day: 0.00    Average packs/day: 1 pack/day for 50.0 years (50.0 ttl pk-yrs)    Types: Cigarettes    Start date: 12/02/1961    Quit date: 12/03/2011    Years since quitting: 11.5   Smokeless tobacco: Never  Vaping Use   Vaping status: Never Used  Substance and Sexual Activity   Alcohol use: Yes    Alcohol/week: 2.0 standard drinks of alcohol    Types: 2 Glasses of wine per week   Drug use: No   Sexual activity: Not on file  Other Topics Concern   Not on file  Social History Narrative   Not on file   Social Determinants of Health   Financial Resource Strain: Not on file  Food Insecurity: Not on file  Transportation Needs: Not on file  Physical Activity: Not on file  Stress: Not on file  Social Connections: Not on file  Intimate Partner Violence: Not on file    Family History  Problem Relation Age of Onset   CAD Father    Diabetes Brother    Colon cancer Maternal Aunt        age 1-60   Colon polyps Neg Hx    Rectal cancer Neg Hx    Stomach cancer Neg Hx    Esophageal cancer Neg Hx     Review of Systems:  As stated in the HPI and otherwise negative.   BP 122/60   Pulse 79   Ht 6\' 1"  (1.854 m)   Wt 83.7 kg   SpO2 96%   BMI 24.36 kg/m   Physical Examination: General: Well developed, well nourished, NAD  HEENT: OP clear, mucus membranes moist  SKIN: warm, dry. No rashes. Neuro: No focal deficits  Musculoskeletal: Muscle strength 5/5 all ext  Psychiatric: Mood and affect normal  Neck: No JVD, no carotid bruits, no thyromegaly, no lymphadenopathy.  Lungs:Clear bilaterally, no wheezes, rhonci, crackles Cardiovascular: Regular rate and rhythm.  Loud, harsh systolic murmur.  Abdomen:Soft. Bowel sounds  present. Non-tender.  Extremities: No lower extremity edema. Pulses are 2 + in the bilateral DP/PT.  EKG:  EKG is not ordered today. The ekg ordered today demonstrates   Echo May 2024: 1. Left ventricular ejection fraction, by estimation, is 60 to 65%. The  left ventricle has normal function. The left ventricle has no regional  wall motion abnormalities. There is mild left ventricular hypertrophy.  Left ventricular diastolic parameters  are consistent with Grade I diastolic dysfunction (  impaired relaxation).  The average left ventricular global longitudinal strain is -24.6 %.   2. Right ventricular systolic function is normal. The right ventricular  size is normal. Tricuspid regurgitation signal is inadequate for assessing  PA pressure.   3. The mitral valve is normal in structure. Trivial mitral valve  regurgitation.   4. Aortic dilatation noted. There is dilatation of the ascending aorta,  measuring 41 mm.   5. The inferior vena cava is normal in size with greater than 50%  respiratory variability, suggesting right atrial pressure of 3 mmHg.   6. The aortic valve is calcified. There is severe calcifcation of the  aortic valve. Aortic valve regurgitation is moderate. Moderate aortic  valve stenosis. Vmax 3.4 m/s, MG 26 mmHg, AVA 1.2 cm^2, DI 0.30   Recent Labs: No results found for requested labs within last 365 days.   Lipid Panel    Component Value Date/Time   CHOL 108 03/14/2021 0732   TRIG 87 03/14/2021 0732   HDL 44 03/14/2021 0732   CHOLHDL 2.5 03/14/2021 0732   LDLCALC 47 03/14/2021 0732     Wt Readings from Last 3 Encounters:  06/30/23 83.7 kg  06/17/23 88.5 kg  05/21/23 88.5 kg    Assessment and Plan:   1. Aortic insufficiency/aortic stenosis: He has moderate AS and AI by echo in May 2024. He remains asymptomatic. I will plan to repeat his echo in May 2025.    2. CAD without angina: Abnormal calcium score with CAD noted on chest CTA. No chest pain. Continue  ASA and statin. LDL  31 on 07/02/22.   3. HTN: BP is well controlled. 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  Labs/ tests ordered today include:   Orders Placed This Encounter  Procedures   ECHOCARDIOGRAM COMPLETE   Disposition:   F/U with me in 12 months.   Signed, Verne Carrow, MD 06/30/2023 9:47 AM    [facility] [address] [place], [place], [place]  [address] Phone: [phone]; Fax: [phone]

## 2023-07-01 DIAGNOSIS — K080 Exfoliation of teeth due to systemic causes: Secondary | ICD-10-CM | POA: Diagnosis not present

## 2023-07-07 ENCOUNTER — Other Ambulatory Visit: Payer: Self-pay | Admitting: Cardiovascular Disease

## 2023-07-16 DIAGNOSIS — K080 Exfoliation of teeth due to systemic causes: Secondary | ICD-10-CM | POA: Diagnosis not present

## 2023-07-18 DIAGNOSIS — Z1212 Encounter for screening for malignant neoplasm of rectum: Secondary | ICD-10-CM | POA: Diagnosis not present

## 2023-07-18 DIAGNOSIS — E785 Hyperlipidemia, unspecified: Secondary | ICD-10-CM | POA: Diagnosis not present

## 2023-07-18 DIAGNOSIS — R7989 Other specified abnormal findings of blood chemistry: Secondary | ICD-10-CM | POA: Diagnosis not present

## 2023-07-18 DIAGNOSIS — R3121 Asymptomatic microscopic hematuria: Secondary | ICD-10-CM | POA: Diagnosis not present

## 2023-07-25 DIAGNOSIS — J449 Chronic obstructive pulmonary disease, unspecified: Secondary | ICD-10-CM | POA: Diagnosis not present

## 2023-07-25 DIAGNOSIS — Z1339 Encounter for screening examination for other mental health and behavioral disorders: Secondary | ICD-10-CM | POA: Diagnosis not present

## 2023-07-25 DIAGNOSIS — I129 Hypertensive chronic kidney disease with stage 1 through stage 4 chronic kidney disease, or unspecified chronic kidney disease: Secondary | ICD-10-CM | POA: Diagnosis not present

## 2023-07-25 DIAGNOSIS — Z1331 Encounter for screening for depression: Secondary | ICD-10-CM | POA: Diagnosis not present

## 2023-07-25 DIAGNOSIS — Z0000 Encounter for general adult medical examination without abnormal findings: Secondary | ICD-10-CM | POA: Diagnosis not present

## 2023-07-25 DIAGNOSIS — R82998 Other abnormal findings in urine: Secondary | ICD-10-CM | POA: Diagnosis not present

## 2023-07-26 ENCOUNTER — Other Ambulatory Visit: Payer: Self-pay | Admitting: Cardiovascular Disease

## 2023-07-27 ENCOUNTER — Encounter: Payer: Self-pay | Admitting: Cardiovascular Disease

## 2023-07-28 MED ORDER — LOSARTAN POTASSIUM 50 MG PO TABS: 50.0000 mg | ORAL_TABLET | Freq: Every day | ORAL | 3 refills | Status: DC

## 2023-08-25 DIAGNOSIS — L237 Allergic contact dermatitis due to plants, except food: Secondary | ICD-10-CM | POA: Diagnosis not present

## 2023-08-27 DIAGNOSIS — Z85828 Personal history of other malignant neoplasm of skin: Secondary | ICD-10-CM | POA: Diagnosis not present

## 2023-08-27 DIAGNOSIS — D692 Other nonthrombocytopenic purpura: Secondary | ICD-10-CM | POA: Diagnosis not present

## 2023-08-27 DIAGNOSIS — D2261 Melanocytic nevi of right upper limb, including shoulder: Secondary | ICD-10-CM | POA: Diagnosis not present

## 2023-08-27 DIAGNOSIS — D2262 Melanocytic nevi of left upper limb, including shoulder: Secondary | ICD-10-CM | POA: Diagnosis not present

## 2023-08-27 DIAGNOSIS — L570 Actinic keratosis: Secondary | ICD-10-CM | POA: Diagnosis not present

## 2023-09-29 ENCOUNTER — Other Ambulatory Visit: Payer: Self-pay

## 2023-09-29 ENCOUNTER — Encounter (HOSPITAL_BASED_OUTPATIENT_CLINIC_OR_DEPARTMENT_OTHER): Payer: Self-pay

## 2023-09-29 ENCOUNTER — Emergency Department (HOSPITAL_BASED_OUTPATIENT_CLINIC_OR_DEPARTMENT_OTHER)
Admission: EM | Admit: 2023-09-29 | Discharge: 2023-09-29 | Disposition: A | Discharge status: Home / Self Care | Payer: Medicare Other | Attending: Emergency Medicine | Admitting: Emergency Medicine

## 2023-09-29 DIAGNOSIS — Y92019 Unspecified place in single-family (private) house as the place of occurrence of the external cause: Secondary | ICD-10-CM | POA: Insufficient documentation

## 2023-09-29 DIAGNOSIS — N189 Chronic kidney disease, unspecified: Secondary | ICD-10-CM | POA: Diagnosis not present

## 2023-09-29 DIAGNOSIS — W228XXA Striking against or struck by other objects, initial encounter: Secondary | ICD-10-CM | POA: Diagnosis not present

## 2023-09-29 DIAGNOSIS — I129 Hypertensive chronic kidney disease with stage 1 through stage 4 chronic kidney disease, or unspecified chronic kidney disease: Secondary | ICD-10-CM | POA: Diagnosis not present

## 2023-09-29 DIAGNOSIS — S0101XA Laceration without foreign body of scalp, initial encounter: Secondary | ICD-10-CM | POA: Diagnosis not present

## 2023-09-29 DIAGNOSIS — J449 Chronic obstructive pulmonary disease, unspecified: Secondary | ICD-10-CM | POA: Insufficient documentation

## 2023-09-29 DIAGNOSIS — Z79899 Other long term (current) drug therapy: Secondary | ICD-10-CM | POA: Insufficient documentation

## 2023-09-29 DIAGNOSIS — Z7982 Long term (current) use of aspirin: Secondary | ICD-10-CM | POA: Diagnosis not present

## 2023-09-29 DIAGNOSIS — S0990XA Unspecified injury of head, initial encounter: Secondary | ICD-10-CM | POA: Diagnosis not present

## 2023-09-29 NOTE — Discharge Instructions (Signed)
You have been evaluated for your symptoms.  You suffered a laceration to the dome of your scalp.  Fortunately it is no longer bleeding.  Please keep this area dry for the first 48 hours.  After that you may perform some gentle cleansing around the wound and you may apply Neosporin to decrease risk of infection.  If your symptoms worsen do not hesitate to return to the ER.

## 2023-09-29 NOTE — ED Notes (Signed)
Cleansed area keeping the scabbed area intact

## 2023-09-29 NOTE — ED Triage Notes (Signed)
Pt was bending over at home and hit his head on a fuse box. Has a small laceration on the top of his head  3cm in length.

## 2023-09-29 NOTE — ED Provider Notes (Signed)
Stephen Lang   CSN: [id number] Arrival date & time: 09/29/23  1828     History  Chief Complaint  Patient presents with   Laceration    Stephen Lang is a 77 y.o. male.  The history is provided by the patient and medical records. No language interpreter was used.  Laceration    89 male significant history of anemia, COPD, hypertension, CKD presenting for relation of a scalp laceration.  Patient report earlier today he was bending over at home and absolutely struck his head against a fuse box and suffered a laceration to the top of his head.  Incident happened approximately 6 hours ago.  Initially he felt some sharp pain to the affected area but that has subsided after a few minutes.  He did notice some bleeding whenever he used the tissue to dab the wound.  Since the bleeding has been reoccurring patient reach out to PCP but was unable to follow-up closely therefore he came here for further assessment.  However while waiting in the waiting room for the past few hours bleeding has stopped.  Patient otherwise up-to-date with tetanus denies any significant headache neck pain lightheadedness dizziness nausea vomiting confusion vital signs sensitivity.  He is not on any blood thinner medication except for baby aspirin.  He is without any other complaint.  Home Medications Prior to Admission medications   Medication Sig Start Date End Date Taking? Authorizing Provider  acetaminophen (TYLENOL) 500 MG tablet Take 1,000 mg by mouth every 8 (eight) hours as needed for moderate pain.    [provider]  aspirin EC 81 MG tablet Take 1 tablet (81 mg total) by mouth daily. Swallow whole. 12/20/20   Kathleene Hazel, MD  losartan (COZAAR) 50 MG tablet Take 1 tablet (50 mg total) by mouth daily. 07/28/23   Kathleene Hazel, MD  olopatadine (PATANOL) 0.1 % ophthalmic solution Place 1 drop into both eyes 2 (two) times daily.  03/22/16   [provider]  oxyCODONE (ROXICODONE) 5 MG immediate release tablet Take 1 tablet (5 mg total) by mouth every 4 (four) hours as needed for severe pain. 11/02/21   Yolonda Kida, MD  rosuvastatin (CRESTOR) 5 MG tablet TAKE 1 TABLET BY MOUTH EVERY DAY 07/07/23   Kathleene Hazel, MD  sildenafil (VIAGRA) 100 MG tablet Take 100 mg by mouth as needed.    [provider]  triamcinolone cream (KENALOG) 0.1 % Apply 1 Application topically 2 (two) times daily as needed.    [provider]      Allergies    Midazolam, Other, Droperidol, and Short ragweed pollen ext    Review of Systems   Review of Systems  All other systems reviewed and are negative.   Physical Exam Updated Vital Signs BP 99/62 (BP Location: Right Arm)   Pulse 90   Temp 98.2 F (36.8 C) (Oral)   Resp 18   Ht 6\' 1"  (1.854 m)   Wt 86.2 kg   SpO2 98%   BMI 25.07 kg/m  Physical Exam Vitals and nursing Lang reviewed.  Constitutional:      General: He is not in acute distress.    Appearance: He is well-developed.  HENT:     Head: Normocephalic.     Comments: To the vertex of the scalp there is a 1 cm laceration with an overlying scab not actively bleeding.  It is minimally tender to palpation no crepitus no foreign  body noted.   Eyes:     Conjunctiva/sclera: Conjunctivae normal.  Musculoskeletal:     Cervical back: Neck supple.  Skin:    Findings: No rash.  Neurological:     Mental Status: He is alert.     ED Results / Procedures / Treatments   Labs (all labs ordered are listed, but only abnormal results are displayed) Labs Reviewed - No data to display  EKG None  Radiology No results found.  Procedures Procedures    Medications Ordered in ED Medications - No data to display  ED Course/ Medical Decision Making/ A&P                                 Medical Decision Making  BP 99/62 (BP Location: Right Arm)   Pulse 90   Temp 98.2 F (36.8 C)  (Oral)   Resp 18   Ht 6\' 1"  (1.854 m)   Wt 86.2 kg   SpO2 98%   BMI 25.07 kg/m   9:44 PM  59 male significant history of anemia, COPD, hypertension, CKD presenting for relation of a scalp laceration.  Patient report earlier today he was bending over at home and absolutely struck his head against a fuse box and suffered a laceration to the top of his head.  Incident happened approximately 6 hours ago.  Initially he felt some sharp pain to the affected area but that has subsided after a few minutes.  He did notice some bleeding whenever he used the tissue to dab the wound.  Since the bleeding has been reoccurring patient reach out to PCP but was unable to follow-up closely therefore he came here for further assessment.  However while waiting in the waiting room for the past few hours bleeding has stopped.  Patient otherwise up-to-date with tetanus denies any significant headache neck pain lightheadedness dizziness nausea vomiting confusion vital signs sensitivity.  He is not on any blood thinner medication except for baby aspirin.  He is without any other complaint.  Exam remarkable for superficial laceration to the vertex of scalp with a scab forming on top and it is not actively bleeding.  No foreign body noted.  No significant tenderness to palpation.  DDx: Superficial laceration, skull fracture, epidural hematoma, deep laceration  Based on mechanism, I have very low suspicion for intracranial injury or skull injury.  Laceration is no longer bleeding and I do not think patient necessary need laceration repair.  Wounds were cleaned, and appropriate dressing placed.  Patient gave discharge instruction and return precaution as well.  Patient agrees with plan and agrees with no suture repair.        Final Clinical Impression(s) / ED Diagnoses Final diagnoses:  Scalp laceration, initial encounter    Rx / DC Orders ED Discharge Orders     None         Fayrene Helper, PA-C 09/29/23  2204    Anders Simmonds T, DO 09/30/23 2353

## 2023-09-29 NOTE — ED Notes (Signed)
Pt has a small laceration to top of head, pt hit head on a fuse box

## 2023-10-02 DIAGNOSIS — I1310 Hypertensive heart and chronic kidney disease without heart failure, with stage 1 through stage 4 chronic kidney disease, or unspecified chronic kidney disease: Secondary | ICD-10-CM | POA: Diagnosis not present

## 2024-01-06 DIAGNOSIS — K080 Exfoliation of teeth due to systemic causes: Secondary | ICD-10-CM | POA: Diagnosis not present

## 2024-01-25 ENCOUNTER — Telehealth: Payer: Self-pay | Admitting: Internal Medicine

## 2024-01-25 NOTE — Telephone Encounter (Signed)
 Called with BP being elevated to 170 and dizzy but now his BP is better at 140s. I instructed him to call his cardiologist in morning. I advised if he has worse dizziness, syncope, chest pain, change in vision or severe HA to come to ED.

## 2024-01-26 ENCOUNTER — Encounter: Payer: Self-pay | Admitting: Cardiovascular Disease

## 2024-02-19 DIAGNOSIS — N401 Enlarged prostate with lower urinary tract symptoms: Secondary | ICD-10-CM | POA: Diagnosis not present

## 2024-02-19 DIAGNOSIS — N138 Other obstructive and reflux uropathy: Secondary | ICD-10-CM | POA: Diagnosis not present

## 2024-02-24 MED ORDER — AMLODIPINE BESYLATE 5 MG PO TABS: 5.0000 mg | ORAL_TABLET | Freq: Every day | ORAL | 3 refills | Status: AC

## 2024-02-24 NOTE — Telephone Encounter (Signed)
 Called and spoke with patient who states he will pick up the Amlodipine and begin tomorrow. All questions answered. He will continue to monitor BP and let us know of any issues that may arise.

## 2024-03-01 ENCOUNTER — Telehealth: Payer: Self-pay | Admitting: Cardiovascular Disease

## 2024-03-01 NOTE — Telephone Encounter (Signed)
 Per MyChart scheduling message:   a follow-up question to Dr. Clifton James.  Within the next 2 weeks I will need to refill my Losartan Potassium 50 mg tab.  Since I have been taking 2/day rather than 1/day, will I be challenged on why refill so early.  I got 90 tabs 01/19/24 and refill says "1 by 07/27/24."   Will there be a problem or [facility] need to be alerted to change?   Thanks - Stephen Lang

## 2024-03-01 NOTE — Telephone Encounter (Signed)
 Left voicemail to return call to office

## 2024-03-03 MED ORDER — LOSARTAN POTASSIUM 50 MG PO TABS: 50.0000 mg | ORAL_TABLET | Freq: Every day | ORAL | 3 refills | Status: DC

## 2024-03-03 NOTE — Telephone Encounter (Signed)
 Patient had increased losartan to 50 mg twice a day before letting us know bp was elevated back in Feb.  At that point amlodipine was added by Dr. Clifton James.  [facility] to [facility] so that patient does not run out of medication.  Will route to Dr. Clifton James to make aware/confirm as patient has continued on the twice daily dosing of losartan 50 mg.

## 2024-03-11 MED ORDER — LOSARTAN POTASSIUM 100 MG PO TABS: 100.0000 mg | ORAL_TABLET | Freq: Every day | ORAL | 3 refills | Status: DC

## 2024-03-11 NOTE — Telephone Encounter (Signed)
 Resent prescription for correct dose - 100 mg daily to [facility] and notified patient.

## 2024-04-12 ENCOUNTER — Ambulatory Visit (HOSPITAL_COMMUNITY): Attending: Cardiology

## 2024-04-12 DIAGNOSIS — I350 Nonrheumatic aortic (valve) stenosis: Secondary | ICD-10-CM | POA: Diagnosis not present

## 2024-04-12 LAB — ECHOCARDIOGRAM COMPLETE
AR max vel: 0.81 cm2
AV Area VTI: 0.96 cm2
AV Area mean vel: 0.85 cm2
AV Mean grad: 23 mmHg
AV Peak grad: 40.6 mmHg
Ao pk vel: 3.19 m/s
Area-P 1/2: 2.84 cm2
S' Lateral: 3.15 cm

## 2024-04-14 ENCOUNTER — Ambulatory Visit: Payer: Self-pay

## 2024-04-19 ENCOUNTER — Other Ambulatory Visit (HOSPITAL_COMMUNITY): Payer: Medicare Other

## 2024-05-17 ENCOUNTER — Encounter: Payer: Self-pay | Admitting: Cardiovascular Disease

## 2024-06-02 DIAGNOSIS — Y92009 Unspecified place in unspecified non-institutional (private) residence as the place of occurrence of the external cause: Secondary | ICD-10-CM | POA: Diagnosis not present

## 2024-06-02 DIAGNOSIS — M6208 Separation of muscle (nontraumatic), other site: Secondary | ICD-10-CM | POA: Diagnosis not present

## 2024-06-08 DIAGNOSIS — H52223 Regular astigmatism, bilateral: Secondary | ICD-10-CM | POA: Diagnosis not present

## 2024-06-08 DIAGNOSIS — H0288A Meibomian gland dysfunction right eye, upper and lower eyelids: Secondary | ICD-10-CM | POA: Diagnosis not present

## 2024-06-08 DIAGNOSIS — H43812 Vitreous degeneration, left eye: Secondary | ICD-10-CM | POA: Diagnosis not present

## 2024-06-08 DIAGNOSIS — H353122 Nonexudative age-related macular degeneration, left eye, intermediate dry stage: Secondary | ICD-10-CM | POA: Diagnosis not present

## 2024-06-08 DIAGNOSIS — H35371 Puckering of macula, right eye: Secondary | ICD-10-CM | POA: Diagnosis not present

## 2024-06-10 DIAGNOSIS — K469 Unspecified abdominal hernia without obstruction or gangrene: Secondary | ICD-10-CM | POA: Diagnosis not present

## 2024-06-16 ENCOUNTER — Other Ambulatory Visit: Payer: Self-pay

## 2024-06-16 MED ORDER — ROSUVASTATIN CALCIUM 5 MG PO TABS: 5.0000 mg | ORAL_TABLET | Freq: Every day | ORAL | 0 refills | Status: DC

## 2024-06-19 DIAGNOSIS — M25512 Pain in left shoulder: Secondary | ICD-10-CM | POA: Diagnosis not present

## 2024-06-21 ENCOUNTER — Encounter: Payer: Self-pay | Admitting: Cardiovascular Disease

## 2024-06-23 ENCOUNTER — Telehealth: Payer: Self-pay | Admitting: Cardiovascular Disease

## 2024-06-23 NOTE — Telephone Encounter (Signed)
 Patient is following-up with RN Michalene regarding the BP readings patient sent through MyChart and next steps.

## 2024-06-23 NOTE — Telephone Encounter (Signed)
 Please see encounter dated 06/21/24.

## 2024-06-24 DIAGNOSIS — M6208 Separation of muscle (nontraumatic), other site: Secondary | ICD-10-CM | POA: Diagnosis not present

## 2024-07-08 DIAGNOSIS — K080 Exfoliation of teeth due to systemic causes: Secondary | ICD-10-CM | POA: Diagnosis not present

## 2024-07-08 MED ORDER — LOSARTAN POTASSIUM 100 MG PO TABS: 50.0000 mg | ORAL_TABLET | Freq: Two times a day (BID) | ORAL | Status: AC

## 2024-07-27 DIAGNOSIS — Z1212 Encounter for screening for malignant neoplasm of rectum: Secondary | ICD-10-CM | POA: Diagnosis not present

## 2024-07-27 DIAGNOSIS — I1310 Hypertensive heart and chronic kidney disease without heart failure, with stage 1 through stage 4 chronic kidney disease, or unspecified chronic kidney disease: Secondary | ICD-10-CM | POA: Diagnosis not present

## 2024-07-27 DIAGNOSIS — R82998 Other abnormal findings in urine: Secondary | ICD-10-CM | POA: Diagnosis not present

## 2024-07-27 DIAGNOSIS — Z125 Encounter for screening for malignant neoplasm of prostate: Secondary | ICD-10-CM | POA: Diagnosis not present

## 2024-07-29 DIAGNOSIS — I129 Hypertensive chronic kidney disease with stage 1 through stage 4 chronic kidney disease, or unspecified chronic kidney disease: Secondary | ICD-10-CM | POA: Diagnosis not present

## 2024-07-29 DIAGNOSIS — Z1331 Encounter for screening for depression: Secondary | ICD-10-CM | POA: Diagnosis not present

## 2024-07-29 DIAGNOSIS — Z23 Encounter for immunization: Secondary | ICD-10-CM | POA: Diagnosis not present

## 2024-07-29 DIAGNOSIS — Z0000 Encounter for general adult medical examination without abnormal findings: Secondary | ICD-10-CM | POA: Diagnosis not present

## 2024-07-29 DIAGNOSIS — Z1339 Encounter for screening examination for other mental health and behavioral disorders: Secondary | ICD-10-CM | POA: Diagnosis not present

## 2024-08-30 DIAGNOSIS — D2261 Melanocytic nevi of right upper limb, including shoulder: Secondary | ICD-10-CM | POA: Diagnosis not present

## 2024-08-30 DIAGNOSIS — D2262 Melanocytic nevi of left upper limb, including shoulder: Secondary | ICD-10-CM | POA: Diagnosis not present

## 2024-08-30 DIAGNOSIS — D225 Melanocytic nevi of trunk: Secondary | ICD-10-CM | POA: Diagnosis not present

## 2024-08-30 DIAGNOSIS — Z85828 Personal history of other malignant neoplasm of skin: Secondary | ICD-10-CM | POA: Diagnosis not present

## 2024-09-02 NOTE — Progress Notes (Unsigned)
 No chief complaint on file.  History of Present Illness: 78 yo male with history of HTN, prior tobacco abuse, anemia, COPD, CKD, BPH, aortic valve stenosis and mitral valve disease who is here today for follow up. I saw him as a new consult in December 2021 for the evaluation of abnormal echo with aortic stenosis and insufficiency. Echo ordered in primary care 10/03/20 showed LVEF=50-55%, moderate LVH. Mild MR. Mild aortic stenosis with mean gradient 18 mmHg, AVA 1.9 cm2. There was moderate aortic regurgitation. He had no symptoms so we elected to follow his valvular disease. Echo May 2022 with LVEF=60-65%. Mild AI. Mild aortic stenosis with mean gradient 18 mmHg, AVA 1.6 cm2. Cardiac CT calcium  score 2437. 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 I reviewed this with our CT surgery team and recommendations for follow up CTA in 6 months. Follow up chest CTA in August 2022 with 3.5 cm ascending aorta with small defect along the inferior wall of the proximal arch-stable. Echo May 2023 with normal LV function, moderate AS and mild AI. Most recent echo May 2025 with LVEF=60-65%. Moderate aortic stenosis with mean gradient 29 mmHg, AVA 0.9 cm2, DI 0.28. Mild to moderate AI. Mild MR.    He is here today for follow up. The patient denies any chest pain, dyspnea, palpitations, lower extremity edema, orthopnea, PND, dizziness, near syncope or syncope.   He smoked 1ppd for 50 years and quit in 2013. He is retired Charity fundraiser. His wife is a retired Nurse, mental health at [employer].   Primary Care Physician: Tisovec, Richard W, MD   Past Medical History:  Diagnosis Date   Allergy    SEASONAL   Anemia    Aortic insufficiency    Aortic stenosis    BPH (benign prostatic hyperplasia)    CKD (chronic kidney disease) stage 2, GFR 60-89 ml/min    Complication of anesthesia    Innovar    COPD (chronic obstructive pulmonary disease) (HCC)    Heart murmur    HTN (hypertension)    Mitral regurgitation    Serrated adenoma of colon 01/2005    Past Surgical History:  Procedure Laterality Date   APPENDECTOMY     COLONOSCOPY     COLONOSCOPY W/ POLYPECTOMY     GUM SURGERY  12/02/2006   tumor right upper gum    ORIF CLAVICULAR FRACTURE Left 11/02/2021   Procedure: OPEN REDUCTION INTERNAL FIXATION (ORIF) CLAVICULAR FRACTURE;  Surgeon: Sharl Selinda Dover, MD;  Location: [facility] OR;  Service: Orthopedics;  Laterality: Left;    Current Outpatient Medications  Medication Sig Dispense Refill   acetaminophen  (TYLENOL ) 500 MG tablet Take 1,000 mg by mouth every 8 (eight) hours as needed for moderate pain.     amLODipine  (NORVASC ) 5 MG tablet Take 1 tablet (5 mg total) by mouth daily. 90 tablet 3   aspirin  EC 81 MG tablet Take 1 tablet (81 mg total) by mouth daily. Swallow whole. 90 tablet 3   losartan  (COZAAR ) 100 MG tablet Take 0.5 tablets (50 mg total) by mouth 2 (two) times daily.     olopatadine (PATANOL) 0.1 % ophthalmic solution Place 1 drop into both eyes 2 (two) times daily.     oxyCODONE  (ROXICODONE ) 5 MG immediate release tablet Take 1 tablet (5 mg total) by mouth every 4 (four) hours as needed for severe pain. 22 tablet 0   rosuvastatin  (CRESTOR )  5 MG tablet Take 1 tablet (5 mg total) by mouth daily. 90 tablet 0   sildenafil (VIAGRA) 100 MG tablet Take 100 mg by mouth as needed.     triamcinolone cream (KENALOG) 0.1 % Apply 1 Application topically 2 (two) times daily as needed.     No current facility-administered medications for this visit.    Allergies  Allergen Reactions   Midazolam Anaphylaxis    History of reaction to a similar family of this anesthesia   Other Anaphylaxis    Innovar=respiratory arrest   Droperidol Other (See Comments)    Unsure of allergy  Other Reaction(s): Other (See Comments)  other   Short Ragweed Pollen Ext     Other Reaction(s): Other  (See Comments)    Social History   Socioeconomic History   Marital status: Married    Spouse name: Not on file   Number of children: 2   Years of education: Not on file   Highest education level: Not on file  Occupational History   Occupation: Retired Astronomer  Tobacco Use   Smoking status: Former    Current packs/day: 0.00    Average packs/day: 1 pack/day for 50.0 years (50.0 ttl pk-yrs)    Types: Cigarettes    Start date: 12/02/1961    Quit date: 12/03/2011    Years since quitting: 12.7   Smokeless tobacco: Never  Vaping Use   Vaping status: Never Used  Substance and Sexual Activity   Alcohol  use: Yes    Alcohol /week: 2.0 standard drinks of alcohol     Types: 2 Glasses of wine per week   Drug use: No   Sexual activity: Not on file  Other Topics Concern   Not on file  Social History Narrative   Not on file   Social Drivers of Health   Financial Resource Strain: Not on file  Food Insecurity: Low Risk  (02/19/2024)   Received from [facility]   Hunger Vital Sign    Within the past 12 months, you worried that your food would run out before you got money to buy more: Never true    Within the past 12 months, the food you bought just didn't last and you didn't have money to get more. : Never true  Transportation Needs: No Transportation Needs (02/19/2024)   Received from [facility]    In the past 12 months, has lack of reliable transportation kept you from medical appointments, meetings, work or from getting things needed for daily living? : No  Physical Activity: Not on file  Stress: Not on file  Social Connections: Not on file  Intimate Partner Violence: Not on file    Family History  Problem Relation Age of Onset   CAD Father    Diabetes Brother    Colon cancer Maternal Aunt        age 52-60   Colon polyps Neg Hx    Rectal cancer Neg Hx    Stomach cancer Neg Hx    Esophageal cancer Neg Hx     Review of Systems:  As stated in the HPI  and otherwise negative.   There were no vitals taken for this visit.  Physical Examination: General: Well developed, well nourished, NAD  HEENT: OP clear, mucus membranes moist  SKIN: warm, dry. No rashes. Neuro: No focal deficits  Musculoskeletal: Muscle strength 5/5 all ext  Psychiatric: Mood and affect normal  Neck: No JVD, no carotid bruits, no thyromegaly, no lymphadenopathy.  Lungs:Clear  bilaterally, no wheezes, rhonci, crackles Cardiovascular: Regular rate and rhythm. No murmurs, gallops or rubs. Abdomen:Soft. Bowel sounds present. Non-tender.  Extremities: No lower extremity edema. Pulses are 2 + in the bilateral DP/PT.  EKG:  EKG is not *** ordered today. The ekg ordered today demonstrates   Recent Labs: No results found for requested labs within last 365 days.   Lipid Panel    Component Value Date/Time   CHOL 108 03/14/2021 0732   TRIG 87 03/14/2021 0732   HDL 44 03/14/2021 0732   CHOLHDL 2.5 03/14/2021 0732   LDLCALC 47 03/14/2021 0732     Wt Readings from Last 3 Encounters:  09/29/23 190 lb (86.2 kg)  06/30/23 184 lb 9.6 oz (83.7 kg)  06/17/23 195 lb (88.5 kg)    Assessment and Plan:   1. Aortic insufficiency/aortic stenosis: He has moderate to severe AS and moderate AI by echo in May 2025. He remains asymptomatic. Repeat echo in may 2026.    2. CAD without angina: Abnormal calcium  score in 2022 with CAD noted on chest CTA. No chest pain suggestive of angina. Continue ASA and statin. LDL ***   3. HTN: BP is controlled. 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 *** Repeat chest CTA now  Labs/ tests ordered today include:  No orders of the defined types were placed in this encounter.  Disposition:   F/U with me in 12 months.   Signed, Lonni Cash, MD 09/02/2024 2:16 PM    [facility] [address] [place], [place], [place]  [address] Phone: [phone]; Fax: [phone]

## 2024-09-03 ENCOUNTER — Encounter: Payer: Self-pay | Admitting: Cardiovascular Disease

## 2024-09-03 ENCOUNTER — Ambulatory Visit: Attending: Cardiovascular Disease | Admitting: Cardiovascular Disease

## 2024-09-03 ENCOUNTER — Other Ambulatory Visit: Payer: Self-pay

## 2024-09-03 VITALS — BP 120/80 | HR 92 | Resp 16 | Ht 73.0 in | Wt 184.0 lb

## 2024-09-03 DIAGNOSIS — I350 Nonrheumatic aortic (valve) stenosis: Secondary | ICD-10-CM

## 2024-09-03 DIAGNOSIS — I2510 Atherosclerotic heart disease of native coronary artery without angina pectoris: Secondary | ICD-10-CM

## 2024-09-03 DIAGNOSIS — I712 Thoracic aortic aneurysm, without rupture, unspecified: Secondary | ICD-10-CM

## 2024-09-03 DIAGNOSIS — I10 Essential (primary) hypertension: Secondary | ICD-10-CM

## 2024-09-03 LAB — BASIC METABOLIC PANEL WITH GFR
BUN/Creatinine Ratio: 22 (ref 10–24)
BUN: 32 mg/dL — ABNORMAL HIGH (ref 8–27)
CO2: 21 mmol/L (ref 20–29)
Calcium: 9.3 mg/dL (ref 8.6–10.2)
Chloride: 104 mmol/L (ref 96–106)
Creatinine, Ser: 1.44 mg/dL — ABNORMAL HIGH (ref 0.76–1.27)
Glucose: 81 mg/dL (ref 70–99)
Potassium: 5.4 mmol/L — ABNORMAL HIGH (ref 3.5–5.2)
Sodium: 137 mmol/L (ref 134–144)
eGFR: 49 mL/min/1.73 — ABNORMAL LOW (ref >59)

## 2024-09-03 NOTE — Patient Instructions (Signed)
 Medication Instructions:  Your physician recommends that you continue on your current medications as directed. Please refer to the Current Medication list given to you today.  *If you need a refill on your cardiac medications before your next appointment, please call your pharmacy*  Lab Work: TODAY: BMET  Testing/Procedures: Echocardiogram - May 2026  Your physician has requested that you have an echocardiogram. Echocardiography is a painless test that uses sound waves to create images of your heart. It provides your doctor with information about the size and shape of your heart and how well your heart's chambers and valves are working. This procedure takes approximately one hour. There are no restrictions for this procedure. Please do NOT wear cologne, perfume, aftershave, or lotions (deodorant is allowed). Please arrive 15 minutes prior to your appointment time.  Please note: We ask at that you not bring children with you during ultrasound (echo/ vascular) testing. Due to room size and safety concerns, children are not allowed in the ultrasound rooms during exams. Our front office staff cannot provide observation of children in our lobby area while testing is being conducted. An adult accompanying a patient to their appointment will only be allowed in the ultrasound room at the discretion of the ultrasound technician under special circumstances. We apologize for any inconvenience.  Chest CTA Your physician has requested that you have cardiac CT. Cardiac computed tomography (CT) is a painless test that uses an x-ray machine to take clear, detailed pictures of your heart. For further information please visit [email or web address] Please follow instruction sheet as given.   Follow-Up: At [facility], you and your health needs are our priority.  As part of our continuing mission to provide you with exceptional heart care, our providers are all part of one team.  This team includes your  primary Cardiologist (physician) and Advanced Practice Providers or APPs (Physician Assistants and Nurse Practitioners) who all work together to provide you with the care you need, when you need it.  Your next appointment:   1 year  Provider:   Lonni Cash, MD

## 2024-09-06 ENCOUNTER — Ambulatory Visit: Payer: Self-pay | Admitting: Cardiovascular Disease

## 2024-09-06 DIAGNOSIS — H903 Sensorineural hearing loss, bilateral: Secondary | ICD-10-CM | POA: Diagnosis not present

## 2024-09-14 DIAGNOSIS — K080 Exfoliation of teeth due to systemic causes: Secondary | ICD-10-CM | POA: Diagnosis not present

## 2024-09-19 ENCOUNTER — Other Ambulatory Visit: Payer: Self-pay | Admitting: Cardiovascular Disease

## 2024-09-20 MED ORDER — ROSUVASTATIN CALCIUM 5 MG PO TABS: 5.0000 mg | ORAL_TABLET | Freq: Every day | ORAL | 3 refills | Status: AC

## 2024-09-20 NOTE — Addendum Note (Signed)
 Addended by: Brent Noto on: 09/20/2024 08:59 AM   Modules accepted: Orders

## 2024-09-22 DIAGNOSIS — H6123 Impacted cerumen, bilateral: Secondary | ICD-10-CM | POA: Diagnosis not present

## 2024-09-22 DIAGNOSIS — H903 Sensorineural hearing loss, bilateral: Secondary | ICD-10-CM | POA: Diagnosis not present

## 2024-09-22 DIAGNOSIS — H9312 Tinnitus, left ear: Secondary | ICD-10-CM | POA: Diagnosis not present

## 2024-09-22 DIAGNOSIS — H938X1 Other specified disorders of right ear: Secondary | ICD-10-CM | POA: Diagnosis not present

## 2024-10-04 ENCOUNTER — Ambulatory Visit (HOSPITAL_COMMUNITY)
Admission: RE | Admit: 2024-10-04 | Discharge: 2024-10-04 | Disposition: A | Discharge status: Home / Self Care | Source: Ambulatory Visit | Attending: Cardiovascular Disease | Admitting: Cardiovascular Disease

## 2024-10-04 DIAGNOSIS — I712 Thoracic aortic aneurysm, without rupture, unspecified: Secondary | ICD-10-CM | POA: Diagnosis not present

## 2024-10-04 DIAGNOSIS — I10 Essential (primary) hypertension: Secondary | ICD-10-CM | POA: Insufficient documentation

## 2024-10-04 DIAGNOSIS — J439 Emphysema, unspecified: Secondary | ICD-10-CM | POA: Diagnosis not present

## 2024-10-04 DIAGNOSIS — I2510 Atherosclerotic heart disease of native coronary artery without angina pectoris: Secondary | ICD-10-CM | POA: Diagnosis not present

## 2024-10-04 DIAGNOSIS — I700 Atherosclerosis of aorta: Secondary | ICD-10-CM | POA: Diagnosis not present

## 2024-10-04 DIAGNOSIS — I350 Nonrheumatic aortic (valve) stenosis: Secondary | ICD-10-CM | POA: Diagnosis not present

## 2024-10-04 MED ORDER — IOHEXOL 350 MG/ML SOLN
75.0000 mL | Freq: Once | INTRAVENOUS | Status: AC | PRN
  Administered 2024-10-04: 75 mL via INTRAVENOUS

## 2025-04-04 ENCOUNTER — Other Ambulatory Visit (HOSPITAL_COMMUNITY)
